# Patient Record
Sex: Female | Born: 1940 | Race: Black or African American | Hispanic: No | Marital: Single | Smoking: Former smoker
Health system: Southern US, Community
[De-identification: ages and names within clinical notes are randomized; demographics above are authoritative.]

## PROBLEM LIST (undated history)

## (undated) DIAGNOSIS — I639 Cerebral infarction, unspecified: Secondary | ICD-10-CM

## (undated) DIAGNOSIS — E0590 Thyrotoxicosis, unspecified without thyrotoxic crisis or storm: Secondary | ICD-10-CM

## (undated) DIAGNOSIS — Z5189 Encounter for other specified aftercare: Secondary | ICD-10-CM

## (undated) DIAGNOSIS — I10 Essential (primary) hypertension: Secondary | ICD-10-CM

## (undated) DIAGNOSIS — E7800 Pure hypercholesterolemia, unspecified: Secondary | ICD-10-CM

## (undated) HISTORY — DX: Cerebral infarction, unspecified: I63.9

## (undated) HISTORY — PX: OTHER SURGICAL HISTORY: SHX169

---

## 1997-10-24 ENCOUNTER — Emergency Department (HOSPITAL_COMMUNITY): Admission: EM | Admit: 1997-10-24 | Discharge: 1997-10-24 | Payer: Self-pay | Admitting: Emergency Medicine

## 1999-08-13 ENCOUNTER — Other Ambulatory Visit: Admission: RE | Admit: 1999-08-13 | Discharge: 1999-08-13 | Payer: Self-pay | Admitting: Family Medicine

## 1999-09-10 ENCOUNTER — Encounter: Payer: Self-pay | Admitting: Family Medicine

## 1999-09-10 ENCOUNTER — Ambulatory Visit (HOSPITAL_COMMUNITY): Admission: RE | Admit: 1999-09-10 | Discharge: 1999-09-10 | Payer: Self-pay | Admitting: Family Medicine

## 1999-09-16 ENCOUNTER — Encounter: Payer: Self-pay | Admitting: Family Medicine

## 1999-09-16 ENCOUNTER — Encounter: Admission: RE | Admit: 1999-09-16 | Discharge: 1999-09-16 | Payer: Self-pay | Admitting: Family Medicine

## 2000-08-27 ENCOUNTER — Encounter: Payer: Self-pay | Admitting: Emergency Medicine

## 2000-08-27 ENCOUNTER — Emergency Department (HOSPITAL_COMMUNITY): Admission: EM | Admit: 2000-08-27 | Discharge: 2000-08-27 | Payer: Self-pay | Admitting: Emergency Medicine

## 2000-09-01 ENCOUNTER — Emergency Department (HOSPITAL_COMMUNITY): Admission: EM | Admit: 2000-09-01 | Discharge: 2000-09-01 | Payer: Self-pay | Admitting: Emergency Medicine

## 2000-09-01 ENCOUNTER — Encounter: Payer: Self-pay | Admitting: Emergency Medicine

## 2002-01-03 ENCOUNTER — Other Ambulatory Visit: Admission: RE | Admit: 2002-01-03 | Discharge: 2002-01-03 | Payer: Self-pay | Admitting: Family Medicine

## 2002-06-14 ENCOUNTER — Ambulatory Visit (HOSPITAL_COMMUNITY): Admission: RE | Admit: 2002-06-14 | Discharge: 2002-06-14 | Payer: Self-pay | Admitting: Family Medicine

## 2002-06-14 ENCOUNTER — Encounter: Payer: Self-pay | Admitting: Family Medicine

## 2005-06-22 ENCOUNTER — Encounter: Payer: Self-pay | Admitting: Emergency Medicine

## 2006-03-14 ENCOUNTER — Emergency Department (HOSPITAL_COMMUNITY): Admission: EM | Admit: 2006-03-14 | Discharge: 2006-03-14 | Payer: Self-pay | Admitting: Emergency Medicine

## 2006-09-12 ENCOUNTER — Encounter: Payer: Self-pay | Admitting: Family Medicine

## 2007-01-17 ENCOUNTER — Ambulatory Visit: Payer: Self-pay | Admitting: Family Medicine

## 2007-01-17 DIAGNOSIS — R9431 Abnormal electrocardiogram [ECG] [EKG]: Secondary | ICD-10-CM

## 2007-01-17 DIAGNOSIS — E785 Hyperlipidemia, unspecified: Secondary | ICD-10-CM

## 2007-01-17 DIAGNOSIS — I10 Essential (primary) hypertension: Secondary | ICD-10-CM

## 2007-01-22 ENCOUNTER — Telehealth (INDEPENDENT_AMBULATORY_CARE_PROVIDER_SITE_OTHER): Payer: Self-pay | Admitting: *Deleted

## 2007-01-26 ENCOUNTER — Ambulatory Visit: Payer: Self-pay | Admitting: Cardiovascular Disease

## 2007-02-06 ENCOUNTER — Ambulatory Visit: Payer: Self-pay

## 2007-02-06 ENCOUNTER — Encounter: Payer: Self-pay | Admitting: Cardiovascular Disease

## 2007-05-14 ENCOUNTER — Ambulatory Visit: Payer: Self-pay | Admitting: Family Medicine

## 2007-05-14 DIAGNOSIS — B49 Unspecified mycosis: Secondary | ICD-10-CM

## 2007-05-14 LAB — CONVERTED CEMR LAB
ALT: 10 units/L (ref 0–35)
Total Bilirubin: 0.8 mg/dL (ref 0.3–1.2)
Triglycerides: 51 mg/dL (ref 0–149)
VLDL: 10 mg/dL (ref 0–40)

## 2007-05-17 ENCOUNTER — Encounter: Payer: Self-pay | Admitting: Family Medicine

## 2007-05-29 ENCOUNTER — Telehealth: Payer: Self-pay | Admitting: Family Medicine

## 2007-12-16 DIAGNOSIS — IMO0002 Reserved for concepts with insufficient information to code with codable children: Secondary | ICD-10-CM

## 2007-12-17 ENCOUNTER — Ambulatory Visit: Payer: Self-pay | Admitting: Family Medicine

## 2008-01-23 ENCOUNTER — Other Ambulatory Visit: Admission: RE | Admit: 2008-01-23 | Discharge: 2008-01-23 | Payer: Self-pay | Admitting: Family Medicine

## 2008-01-23 ENCOUNTER — Ambulatory Visit: Payer: Self-pay | Admitting: Family Medicine

## 2008-01-23 ENCOUNTER — Encounter: Payer: Self-pay | Admitting: Family Medicine

## 2008-01-23 ENCOUNTER — Telehealth: Payer: Self-pay | Admitting: Family Medicine

## 2008-01-24 ENCOUNTER — Telehealth: Payer: Self-pay | Admitting: Family Medicine

## 2008-01-29 ENCOUNTER — Encounter: Admission: RE | Admit: 2008-01-29 | Discharge: 2008-01-29 | Payer: Self-pay | Admitting: Family Medicine

## 2008-01-31 ENCOUNTER — Ambulatory Visit: Payer: Self-pay | Admitting: Internal Medicine

## 2008-02-24 ENCOUNTER — Telehealth: Payer: Self-pay | Admitting: Internal Medicine

## 2008-02-25 ENCOUNTER — Encounter: Payer: Self-pay | Admitting: Internal Medicine

## 2008-02-25 ENCOUNTER — Ambulatory Visit: Payer: Self-pay | Admitting: Internal Medicine

## 2008-03-04 ENCOUNTER — Encounter: Payer: Self-pay | Admitting: Internal Medicine

## 2008-09-01 ENCOUNTER — Ambulatory Visit: Payer: Self-pay | Admitting: Family Medicine

## 2008-09-01 LAB — CONVERTED CEMR LAB
CO2: 30 meq/L (ref 19–32)
GFR calc non Af Amer: 80.15 mL/min (ref >60)

## 2008-09-04 ENCOUNTER — Telehealth: Payer: Self-pay | Admitting: Family Medicine

## 2008-09-19 ENCOUNTER — Telehealth: Payer: Self-pay | Admitting: Family Medicine

## 2008-12-19 ENCOUNTER — Telehealth: Payer: Self-pay | Admitting: Family Medicine

## 2009-01-29 ENCOUNTER — Ambulatory Visit: Payer: Self-pay | Admitting: Internal Medicine

## 2009-01-29 ENCOUNTER — Encounter: Payer: Self-pay | Admitting: Family Medicine

## 2009-01-29 ENCOUNTER — Encounter: Admission: RE | Admit: 2009-01-29 | Discharge: 2009-01-29 | Payer: Self-pay | Admitting: Family Medicine

## 2009-05-12 ENCOUNTER — Telehealth: Payer: Self-pay | Admitting: Family Medicine

## 2009-12-28 ENCOUNTER — Ambulatory Visit: Payer: Self-pay | Admitting: Family Medicine

## 2009-12-28 DIAGNOSIS — L255 Unspecified contact dermatitis due to plants, except food: Secondary | ICD-10-CM | POA: Insufficient documentation

## 2009-12-28 LAB — CONVERTED CEMR LAB
Alkaline Phosphatase: 89 units/L (ref 39–117)
Basophils Relative: 0.6 % (ref 0.0–3.0)
CO2: 32 meq/L (ref 19–32)
Calcium: 10.3 mg/dL (ref 8.4–10.5)
Chloride: 100 meq/L (ref 96–112)
Eosinophils Absolute: 0.5 10*3/uL (ref 0.0–0.7)
Eosinophils Relative: 8.8 % — ABNORMAL HIGH (ref 0.0–5.0)
Glucose, Bld: 123 mg/dL — ABNORMAL HIGH (ref 70–99)
HCT: 38 % (ref 36.0–46.0)
Hemoglobin: 13.1 g/dL (ref 12.0–15.0)
Lymphocytes Relative: 30 % (ref 12.0–46.0)
MCHC: 34.5 g/dL (ref 30.0–36.0)
MCV: 93.6 fL (ref 78.0–100.0)
Neutrophils Relative %: 50.3 % (ref 43.0–77.0)
RBC: 4.06 M/uL (ref 3.87–5.11)
TSH: 3.64 microintl units/mL (ref 0.35–5.50)
Total CHOL/HDL Ratio: 5
Total Protein: 6.7 g/dL (ref 6.0–8.3)

## 2009-12-29 ENCOUNTER — Telehealth: Payer: Self-pay | Admitting: Family Medicine

## 2009-12-31 ENCOUNTER — Telehealth: Payer: Self-pay | Admitting: Family Medicine

## 2010-01-07 ENCOUNTER — Encounter: Payer: Self-pay | Admitting: Family Medicine

## 2010-01-07 ENCOUNTER — Ambulatory Visit: Payer: Self-pay | Admitting: Family Medicine

## 2010-01-07 ENCOUNTER — Other Ambulatory Visit: Admission: RE | Admit: 2010-01-07 | Discharge: 2010-01-07 | Payer: Self-pay | Admitting: Family Medicine

## 2010-01-13 ENCOUNTER — Ambulatory Visit: Payer: Self-pay | Admitting: Family Medicine

## 2010-01-19 ENCOUNTER — Telehealth: Payer: Self-pay | Admitting: Family Medicine

## 2010-01-27 ENCOUNTER — Ambulatory Visit: Payer: Self-pay | Admitting: Internal Medicine

## 2010-01-27 DIAGNOSIS — B0229 Other postherpetic nervous system involvement: Secondary | ICD-10-CM | POA: Insufficient documentation

## 2010-01-28 ENCOUNTER — Ambulatory Visit: Payer: Self-pay | Admitting: Family Medicine

## 2010-02-19 ENCOUNTER — Encounter
Admission: RE | Admit: 2010-02-19 | Discharge: 2010-02-19 | Payer: Self-pay | Source: Home / Self Care | Attending: Family Medicine | Admitting: Family Medicine

## 2010-03-21 LAB — CONVERTED CEMR LAB
ALT: 13 units/L (ref 0–35)
AST: 14 units/L (ref 0–37)
Alkaline Phosphatase: 110 units/L (ref 39–117)
Alkaline Phosphatase: 110 units/L (ref 39–117)
Basophils Absolute: 0 10*3/uL (ref 0.0–0.1)
Basophils Relative: 0.5 % (ref 0.0–3.0)
Basophils Relative: 2.3 % — ABNORMAL HIGH (ref 0.0–1.0)
Bilirubin, Direct: 0.1 mg/dL (ref 0.0–0.3)
Bilirubin, Direct: 0.1 mg/dL (ref 0.0–0.3)
Blood in Urine, dipstick: NEGATIVE
CO2: 31 meq/L (ref 19–32)
CO2: 31 meq/L (ref 19–32)
Calcium: 9.2 mg/dL (ref 8.4–10.5)
Chloride: 102 meq/L (ref 96–112)
Chloride: 104 meq/L (ref 96–112)
Creatinine, Ser: 0.7 mg/dL (ref 0.4–1.2)
Direct LDL: 186.4 mg/dL
Direct LDL: 215.3 mg/dL
GFR calc non Af Amer: 89 mL/min
GFR calc non Af Amer: 89 mL/min
Glucose, Bld: 103 mg/dL — ABNORMAL HIGH (ref 70–99)
Glucose, Bld: 143 mg/dL — ABNORMAL HIGH (ref 70–99)
HCT: 39.7 % (ref 36.0–46.0)
Hgb A1c MFr Bld: 6.6 % — ABNORMAL HIGH (ref 4.6–6.0)
Ketones, urine, test strip: NEGATIVE
Lymphocytes Relative: 31 % (ref 12.0–46.0)
MCHC: 34.5 g/dL (ref 30.0–36.0)
MCV: 93.6 fL (ref 78.0–100.0)
MCV: 94.8 fL (ref 78.0–100.0)
Microalb Creat Ratio: 9.1 mg/g (ref 0.0–30.0)
Microalb, Ur: 0.4 mg/dL (ref 0.0–1.9)
Microalb, Ur: 0.5 mg/dL (ref 0.0–1.9)
Monocytes Absolute: 0.4 10*3/uL (ref 0.1–1.0)
Monocytes Absolute: 0.4 10*3/uL (ref 0.2–0.7)
Monocytes Relative: 6.4 % (ref 3.0–11.0)
Neutro Abs: 3.5 10*3/uL (ref 1.4–7.7)
Neutro Abs: 3.9 10*3/uL (ref 1.4–7.7)
Nitrite: NEGATIVE
Potassium: 4 meq/L (ref 3.5–5.1)
RBC: 4.19 M/uL (ref 3.87–5.11)
RDW: 12.7 % (ref 11.5–14.6)
Sodium: 142 meq/L (ref 135–145)
TSH: 3.53 microintl units/mL (ref 0.35–5.50)
TSH: 4.51 microintl units/mL (ref 0.35–5.50)
Total Bilirubin: 0.8 mg/dL (ref 0.3–1.2)
Total Bilirubin: 0.9 mg/dL (ref 0.3–1.2)
Total Protein: 6.9 g/dL (ref 6.0–8.3)
Total Protein: 7.1 g/dL (ref 6.0–8.3)
Triglycerides: 85 mg/dL (ref 0–149)
VLDL: 17 mg/dL (ref 0–40)
WBC Urine, dipstick: NEGATIVE
pH: 7

## 2010-03-23 NOTE — Assessment & Plan Note (Signed)
Summary: rash on face/?allergic reaction or poison ivy/cjr   Vital Signs:  Patient profile:   70 year old female Height:      60 inches Weight:      154 pounds BMI:     30.18 Temp:     99.1 degrees F oral BP sitting:   140 / 80  (left arm) Cuff size:   regular  Vitals Entered By: Kern Reap CMA Duncan Dull) (December 28, 2009 12:53 PM) CC: rash on face, refills   CC:  rash on face and refills.  History of Present Illness: Amanda Conway is a 70 year old female, nonsmoker, who comes in today for a contact dermatitis of her face.  She helped a friend do yard work and developed a skin rash involving her neck, left wrist, and face x 4 days.  It is red, raised, and very pleuritic pain.  Her last hemoglobin A1c was 7.6, July 2010.  She was advised to come in for an annual physical exam and follow-up with her diabetes.  Every 3 months.  She has been non- compliant.  Allergies: 1)  ! Sulfa 2)  ! * Banana  Past History:  Past medical, surgical, family and social histories (including risk factors) reviewed for relevance to current acute and chronic problems.  Past Medical History: Reviewed history from 01/17/2007 and no changes required. 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 Family History Diabetes 1st degree relative Family History High cholesterol Family History Hypertension  Social History: Reviewed history from 01/17/2007 and no changes required. Occupation: Single Never Smoked Alcohol use-no Drug use-no Regular exercise-no  Review of Systems      See HPI  Physical Exam  General:  Well-developed,well-nourished,in no acute distress; alert,appropriate and cooperative throughout examination Skin:  a rash on the face and neck.  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 Take one tablet daily    Amaryl 4 Mg Tabs (Glimepiride) .Marland Kitchen... Take one tablet daliy  Orders: Venipuncture (60454) TLB-Lipid Panel (80061-LIPID) TLB-BMP (Basic Metabolic Panel-BMET) (80048-METABOL) TLB-CBC Platelet - w/Differential (85025-CBCD) TLB-Hepatic/Liver Function Pnl (80076-HEPATIC) TLB-TSH (Thyroid Stimulating Hormone) (84443-TSH) TLB-A1C / Hgb A1C (Glycohemoglobin) (83036-A1C) TLB-Microalbumin/Creat Ratio, Urine (82043-MALB) Prescription Created Electronically [phone]) UA Dipstick w/Micro (automated) (81001) Specimen Handling (91478) Glucose, (CBG) (29562)  Problem # 2:  CONTACT DERMATITIS&OTHER ECZEMA DUE TO PLANTS (ICD-692.6) Assessment: New  Her updated medication list for this problem includes:    Prednisone 20 Mg Tabs (Prednisone) ..... Uad  Orders: Venipuncture (13086) TLB-Lipid Panel (80061-LIPID) TLB-BMP (Basic Metabolic Panel-BMET) (80048-METABOL) TLB-CBC Platelet - w/Differential (85025-CBCD) TLB-Hepatic/Liver Function Pnl (80076-HEPATIC) TLB-TSH (Thyroid Stimulating Hormone) (84443-TSH) TLB-A1C / Hgb A1C (Glycohemoglobin) (83036-A1C) TLB-Microalbumin/Creat Ratio, Urine (82043-MALB) Prescription Created Electronically [phone]) UA Dipstick w/Micro (automated) (81001) Specimen Handling (96295)  Complete Medication List: 1)  Lotrel 5-20 Mg Caps (Amlodipine besy-benazepril hcl) .... Take one tablet daily 2)  Amaryl 4 Mg Tabs (Glimepiride) .... Take one tablet daliy 3)  Thalitone 15 Mg Tabs (Chlorthalidone) .... Take 1 tablet by mouth every morning 4)  Prednisone 20 Mg Tabs (Prednisone) .... Uad  Patient Instructions: 1)  begin prednisone at  one tablet daily, x 3 days, a  half a tablet x 3 days, then half a tablet of the other day for two week taper. 2)  Return next Tuesday for complete physical exam......... schedule a 30 minute time slot........Marland Kitchen we will do all your lab work today. 3)  Check a fasting blood sugar daily in the morning Prescriptions: THALITONE 15 MG  TABS (CHLORTHALIDONE) Take 1 tablet by mouth every morning  #30 Tablet x 0   Entered and Authorized by:   Roderick Pee MD   Signed by:   Roderick Pee MD on 12/28/2009   Method used:   Electronically to        [facility] ([facility]ail)       [address]       [place], [place]  [address]       Ph: [phone]       Fax: [phone]   RxID:   [id number] AMARYL 4 MG  TABS (GLIMEPIRIDE) take one tablet daliy  #30 Tablet x 0   Entered and Authorized by:   Roderick Pee MD   Signed by:   Roderick Pee MD on 12/28/2009   Method used:   Electronically to        [facility] (retail)       [address]       [place], [place]  [address]       Ph: [phone]       Fax: [phone]   RxID:   [id number] LOTREL 5-20 MG  CAPS (AMLODIPINE BESY-BENAZEPRIL HCL) take one tablet daily  #30 Capsule x 0   Entered and Authorized by:   Roderick Pee MD   Signed by:   Roderick Pee MD on 12/28/2009   Method used:   Electronically to        [facility] ([facility])       [address]       [place], [place]  [address]       Ph: [phone]       Fax: [phone]   RxID:   [id number] PREDNISONE 20 MG TABS (PREDNISONE) UAD  #30 x 0   Entered and Authorized by:   Roderick Pee MD   Signed by:   Roderick Pee MD on 12/28/2009   Method used:   Electronically to        [facility] (retail)       [address]       [place], [place]  [address]       Ph: [phone]       Fax: [phone]   RxID:   [id number]    Orders Added: 1)   Venipuncture [36144] 2)  TLB-Lipid Panel [80061-LIPID] 3)  TLB-BMP (Basic Metabolic Panel-BMET) [80048-METABOL] 4)  TLB-CBC Platelet - w/Differential [85025-CBCD] 5)  TLB-Hepatic/Liver Function Pnl [80076-HEPATIC] 6)  TLB-TSH (Thyroid Stimulating Hormone) [84443-TSH] 7)  TLB-A1C / Hgb A1C (Glycohemoglobin) [83036-A1C] 8)  TLB-Microalbumin/Creat Ratio, Urine [82043-MALB] 9)  Prescription Created Electronically [G8553] 10)  Est. Patient Level IV [31540] 11)  UA Dipstick w/Micro (automated) [81001] 12)  Specimen Handling [99000] 13)  Glucose, (CBG) [82962]  Appended Document: rash on face/?allergic reaction or poison ivy/cjr  Laboratory Results   Urine Tests    Routine  Urinalysis   Color: yellow Appearance: Clear Glucose: trace   (Normal Range: Negative) Bilirubin: negative   (Normal Range: Negative) Ketone: negative   (Normal Range: Negative) Spec. Gravity: 1.020   (Normal Range: 1.003-1.035) Blood: negative   (Normal Range: Negative) pH: 6.5   (Normal Range: 5.0-8.0) Protein: negative   (Normal Range: Negative) Urobilinogen: 0.2   (Normal Range: 0-1) Nitrite: negative   (Normal Range: Negative) Leukocyte Esterace: negative   (Normal Range: Negative)    Comments: Rita Ohara  December 28, 2009 2:01 PM

## 2010-03-23 NOTE — Assessment & Plan Note (Signed)
Summary: 1 week fup per dr//ccm   Vital Signs:  Patient profile:   70 year old female Menstrual status:  postmenopausal Weight:      154 pounds Temp:     98.9 degrees F oral BP sitting:   130 / 80  (left arm) Cuff size:   regular  Vitals Entered By: Kern Reap CMA Duncan Dull) (January 13, 2010 11:05 AM) CC: follow-up visit   CC:  follow-up visit.  History of Present Illness: Amanda Conway is a 69 year old female, who comes in today for follow-up of a severe contact dermatitis with underlying diabetes.  We originally tried the topical medication.  However, the rash got worse.  We therefore put on prednisone two tabs x 3 days with a taper.  Now she is down to one and then the rash is almost 100% gone.  It did increase her blood sugar, but had been over 300.  Allergies: 1)  ! Sulfa 2)  ! 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 Uad  Complete Medication List: 1)  Lotrel 5-20 Mg Caps (Amlodipine besy-benazepril hcl) .... Take one tablet daily 2)  Prednisone 20 Mg Tabs (Prednisone) .... Uad 3)  Chlorthalidone 25 Mg Tabs (Chlorthalidone) .... Take one tab by mouth once daily 4)  Zocor 10 Mg Tabs (Simvastatin) .Marland Kitchen.. 1 tab @ bedtime 5)  Glucotrol Xl 5 Mg Xr24h-tab (Glipizide) .... Take 1 tablet by mouth two times a day  Patient Instructions: 1)  taper the prednisone slowly by taking one tablet today and tomorrow, then a half a tablet a day for 3 days, then half a tablet Monday, Wednesday, Friday, for a two-week taper.  Return p.r.n.   Orders Added: 1)  Est. Patient Level III  [[address]]

## 2010-03-23 NOTE — Miscellaneous (Signed)
Summary: ACCORD Study  ACCORD Study   Imported By: Maryln Gottron 06/08/2009 13:09:30  _____________________________________________________________________  External Attachment:    Type:   Image     Comment:   External Document

## 2010-03-23 NOTE — Progress Notes (Signed)
Summary: pharmacy change  Phone Note Refill Request Message from:  Fax from Pharmacy on May 12, 2009 10:39 AM  Refills Requested: Medication #1:  AMARYL 4 MG  TABS take one tablet daliy  Method Requested: Electronic Initial call taken by: Kern Reap CMA Duncan Dull),  May 12, 2009 10:40 AM    Prescriptions: AMARYL 4 MG  TABS (GLIMEPIRIDE) take one tablet daliy  #30 Tablet x 6   Entered by:   Kern Reap CMA (AAMA)   Authorized by:   Roderick Pee MD   Signed by:   Kern Reap CMA (AAMA) on 05/12/2009   Method used:   Electronically to        [facility] (retail)       [address]       [place], [place]  [address]       Ph: [phone]       Fax: [phone]   RxID:   [id number]

## 2010-03-23 NOTE — Progress Notes (Signed)
Summary: Pt arm sore and swollen from injection. Req to take Ibuprofen  Phone Note Call from Patient Call back at [facility] Phone [phone]   Caller: Patient Summary of Call: Pt called and said that her rt arm is extremely sore and also swollen, from the injections she rcvd about 2 wks ago. Pt is wondering if she could take Ibuprofen for inflamation and pain? Pls call.     Initial call taken by: Lucy Antigua,  January 19, 2010 9:04 AM  Follow-up for Phone Call        .Rachel please call......... symptomatic therapy with ice 15 minutes 4 times a day Motrin 400 mg with food twice daily Follow-up by: Roderick Pee MD,  January 19, 2010 12:21 PM  Additional Follow-up for Phone Call Additional follow up Details #1::        patient is aware Additional Follow-up by: Kern Reap CMA Duncan Dull),  January 19, 2010 2:53 PM

## 2010-03-23 NOTE — Assessment & Plan Note (Signed)
Summary: cpx per dr//ccm   Vital Signs:  Patient profile:   70 year old female Menstrual status:  postmenopausal Height:      60 inches Weight:      155 pounds BMI:     30.38 Temp:     98.4 degrees F oral BP sitting:   128 / 78  (left arm) Cuff size:   regular  Vitals Entered By: Kern Reap CMA Duncan Dull) (January 07, 2010 12:31 PM) CC: wellness exam Is Patient Diabetic? Yes Did you bring your meter with you today? Yes     Menstrual Status postmenopausal Last PAP Result NEGATIVE FOR INTRAEPITHELIAL LESIONS OR MALIGNANCY.   CC:  wellness exam.  History of Present Illness: Amanda Conway is a 70 year old female, nonsmoker, type II diabetic with hypertension, who comes in today for Medicare wellness examination.  Her hypertension is treated with Lotrel 5 -- 20 daily BP 120/78.  Her diabetes is treated with Amaryl 4 mg daily fasting blood sugar 123 however hemoglobin A1c 8.4%.  We will need to change her medication.we have tried metformin in the past, however, it causes abdominal pain, and diarrhea  She is currently on prednisone because of a severe bout of contact dermatitis.  She's down to one tablet daily.  She gets routine eye care, dental care, BSE monthly, annual mammography, colonoscopy, 2008 normal, tetanus, and flu shot today, Pneumovax, and shingles in one month. Here for Medicare AWV:  1.   Risk factors based on Past M, S, F history:...reviewed.  No changes 2.   Physical Activities: walks daily 3.   Depression/mood: good mood.  No depression 4.   Hearing: normal 5.   ADL's: normal 6.   Fall Risk: reviewed.  None identified at 7.   Home Safety: reviewed.  No guns in the house 8.   Height, weight, &visual acuity:height weight, normal.  Vision normal.  Ophthalmologic evaluation October 2010 no retinopathy 9.   Counseling: change medication as outlined because blood sugar and A1c are elevated 10.   Labs ordered based on risk factors: already done 11.           Referral  Coordination..........none indicated 12.           Care Plan......Marland Kitchenreviewed medications, diet, exercise and diet 13.            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 Sulfa 2)  ! * Banana  Past History:  Past medical, surgical, family and social histories (including risk factors) reviewed, and no changes noted (except as noted below).  Past Medical History: Reviewed history from 01/17/2007 and no changes required. 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 Family History Diabetes 1st degree relative Family History High cholesterol Family History Hypertension  Social History: Reviewed history from 01/17/2007 and no changes required. Occupation: Single Never Smoked Alcohol use-no Drug use-no Regular exercise-no  Review of Systems      See HPI       Flu Vaccine Consent Questions     Do you have a history of severe allergic reactions to this vaccine? no    Any prior history of allergic reactions to egg and/or gelatin? no    Do you have a sensitivity to the preservative Thimersol? no    Do you have a past history of Guillan-Barre Syndrome? no    Do you currently have an acute febrile  illness? no    Have you ever had a severe reaction to latex? no    Vaccine information given and explained to patient? yes    Are you currently pregnant? no    Lot Number:[id number]   Exp Date:08/21/2010   Site Given  Left Deltoid IM   Physical Exam  General:  Well-developed,well-nourished,in no acute distress; alert,appropriate and cooperative throughout examination Head:  Normocephalic and atraumatic without obvious abnormalities. No apparent alopecia or balding. Eyes:  No corneal or conjunctival inflammation noted. EOMI. Perrla. Funduscopic exam benign, without hemorrhages, exudates or papilledema. Vision grossly  normal. Ears:  External ear exam shows no significant lesions or deformities.  Otoscopic examination reveals clear canals, tympanic membranes are intact bilaterally without bulging, retraction, inflammation or discharge. Hearing is grossly normal bilaterally. Nose:  External nasal examination shows no deformity or inflammation. Nasal mucosa are pink and moist without lesions or exudates. Mouth:  Oral mucosa and oropharynx without lesions or exudates.  Teeth in good repair. Neck:  No deformities, masses, or tenderness noted. Chest Wall:  No deformities, masses, or tenderness noted. Breasts:  No mass, nodules, thickening, tenderness, bulging, retraction, inflamation, nipple discharge or skin changes noted.   Lungs:  Normal respiratory effort, chest expands symmetrically. Lungs are clear to auscultation, no crackles or wheezes. Heart:  Normal rate and regular rhythm. S1 and S2 normal without gallop, murmur, click, rub or other extra sounds. Abdomen:  Bowel sounds positive,abdomen soft and non-tender without masses, organomegaly or hernias noted. Rectal:  No external abnormalities noted. Normal sphincter tone. No rectal masses or tenderness. Genitalia:  Pelvic Exam:        External: normal female genitalia without lesions or masses        Vagina: normal without lesions or masses        Cervix: normal without lesions or masses        Adnexa: normal bimanual exam without masses or fullness        Uterus: normal by palpation        Pap smear: performed Msk:  No deformity or scoliosis noted of thoracic or lumbar spine.   Pulses:  R and L carotid,radial,femoral,dorsalis pedis and posterior tibial pulses are full and equal bilaterally Extremities:  No clubbing, cyanosis, edema, or deformity noted with normal full range of motion of all joints.   Neurologic:  No cranial nerve deficits noted. Station and gait are normal. Plantar reflexes are down-going bilaterally. DTRs are symmetrical throughout. Sensory,  motor and coordinative functions appear intact. Skin:  Intact without suspicious lesions or rashes Cervical Nodes:  No lymphadenopathy noted Axillary Nodes:  No palpable lymphadenopathy Inguinal Nodes:  No significant adenopathy Psych:  Cognition and judgment appear intact. Alert and cooperative with normal attention span and concentration. No apparent delusions, illusions, hallucinations  Diabetes Management Exam:    Foot Exam (with socks and/or shoes not present):       Sensory-Pinprick/Light touch:          Left medial foot (L-4): normal          Left dorsal foot (L-5): normal          Left lateral foot (S-1): normal          Right medial foot (L-4): normal          Right dorsal foot (L-5): normal          Right lateral foot (S-1): normal       Sensory-Monofilament:  Left foot: normal          Right foot: normal       Inspection:          Left foot: normal          Right foot: normal       Nails:          Left foot: normal          Right foot: normal    Eye Exam:       Eye Exam done elsewhere          Date: 12/04/2008          Results: normal          Done by: opth   Impression & Recommendations:  Problem # 1:  HYPERTENSION (ICD-401.9) Assessment Improved  Her updated medication list for this problem includes:    Lotrel 5-20 Mg Caps (Amlodipine besy-benazepril hcl) .Marland Kitchen... Take one tablet daily    Chlorthalidone 25 Mg Tabs (Chlorthalidone) .Marland Kitchen... Take one tab by mouth once daily  Orders: Prescription Created Electronically [phone]) Medicare -1st Annual Wellness Visit [id number]) EKG w/ Interpretation (93000)  Problem # 2:  HYPERLIPIDEMIA (ICD-272.4) Assessment: Deteriorated  Her updated medication list for this problem includes:    Zocor 10 Mg Tabs (Simvastatin) .Marland Kitchen... 1 tab @ bedtime  Orders: Prescription Created Electronically [phone]) Medicare -1st Annual Wellness Visit [phone]) EKG w/ Interpretation (93000)  Problem # 3:  DIABETES MELLITUS, TYPE II  (ICD-250.00) Assessment: Deteriorated  The following medications were removed from the medication list:    Amaryl 4 Mg Tabs (Glimepiride) .Marland Kitchen... Take one tablet daliy    Metformin Hcl 500 Mg Tabs (Metformin hcl) .Marland Kitchen... 1/2 by mouth two times a day Her updated medication list for this problem includes:    Lotrel 5-20 Mg Caps (Amlodipine besy-benazepril hcl) .Marland Kitchen... Take one tablet daily    Glucotrol Xl 5 Mg Xr24h-tab (Glipizide) .Marland Kitchen... Take 1 tablet by mouth two times a day  Orders: Prescription Created Electronically [phone]) Medicare -1st Annual Wellness Visit [phone])  Problem # 4:  Preventive Health Care (ICD-V70.0) Assessment: Unchanged  Complete Medication List: 1)  Lotrel 5-20 Mg Caps (Amlodipine besy-benazepril hcl) .... Take one tablet daily 2)  Prednisone 20 Mg Tabs (Prednisone) .... Uad 3)  Chlorthalidone 25 Mg Tabs (Chlorthalidone) .... Take one tab by mouth once daily 4)  Zocor 10 Mg Tabs (Simvastatin) .Marland Kitchen.. 1 tab @ bedtime 5)  Glucotrol Xl 5 Mg Xr24h-tab (Glipizide) .... Take 1 tablet by mouth two times a day  Other Orders: Admin 1st Vaccine (95188) Flu Vaccine 26yrs + (41660) Tdap => 91yrs IM (63016) Admin of Any Addtl Vaccine (01093)  Patient Instructions: 1)  discontinue the Amaryl. 2)  and begin simvastatin 10 mg one tablet daily at bedtime, along with an aspirin tablet 3)  Begin metformin one half tablet twice daily......... 500-mg tabs............. check a fasting blood sugar daily in the morning.  Return in one week for follow-up with the data and the device. 4)  Remember to stay on a sugar-free diet. 5)  Walk 15 minutes daily. 6)  Take calcium +Vitamin D daily. 7)  Take an Aspirin every day. 8)  Check your blood sugars regularly. If your readings are usually above : or below 70 you should contact our office. 9)  It is important that your Diabetic A1c level is checked every 3 months. 10)  See your eye doctor yearly to check for diabetic eye damage. 11)  Check your  feet  each night for sore areas, calluses or signs of infection. 12)  Check your Blood Pressure regularly. If it is above: you should make an appointment. 13)  increase the prednisone to one tablet daily, x 3 days, then half a tablet a day, x 3 days, then half a tablet Monday, Wednesday, Friday, for a two-week taper. 14)  Applied.  Small amounts of the cortisone cream 3 times daily Prescriptions: GLUCOTROL XL 5 MG XR24H-TAB (GLIPIZIDE) Take 1 tablet by mouth two times a day  #200 x 3   Entered and Authorized by:   Roderick Pee MD   Signed by:   Roderick Pee MD on 01/07/2010   Method used:   Electronically to        [facility] (retail)       [address]       [place], [place]  [address]       Ph: [phone]       Fax: [phone]   RxID:   [id number] ZOCOR 10 MG TABS (SIMVASTATIN) 1 tab @ bedtime  #100 x 3   Entered and Authorized by:   Roderick Pee MD   Signed by:   Roderick Pee MD on 01/07/2010   Method used:   Electronically to        [facility] (retail)       [address]       [place], [place]  [address]       Ph: [phone]       Fax: [phone]   RxID:   [id number] METFORMIN HCL 500 MG TABS (METFORMIN HCL) 1/2 by mouth two times a day  #100 x 3   Entered and Authorized by:   Roderick Pee MD   Signed by:   Roderick Pee MD on 01/07/2010   Method used:   Electronically to        [facility] (retail)       [address]       [place], [place]  [address]       Ph: [phone]       Fax: [phone]   RxID:   [id number]    Orders Added: 1)  Prescription Created Electronically [phone] 2)  Medicare -1st Annual Wellness Visit [G0438] 3)  EKG w/ Interpretation [93000] 4)  Admin 1st Vaccine [90471] 5)  Flu Vaccine 65yrs + [82993] 6)  Tdap => 38yrs IM [90715] 7)  Admin of Any Addtl Vaccine  [90472]   Immunizations Administered:  Tetanus Vaccine:    Vaccine Type: Tdap    Site: right deltoid    Mfr: GlaxoSmithKline    Dose: 0.5 ml    Route: IM    Given by: Kern Reap CMA (AAMA)    Exp. Date: 12/11/2011    Lot #: [id number]    Physician counseled: yes   Immunizations Administered:  Tetanus Vaccine:    Vaccine Type: Tdap    Site: right deltoid    Mfr: GlaxoSmithKline    Dose: 0.5 ml    Route: IM    Given by: Kern Reap CMA (AAMA)    Exp. Date: 12/11/2011    Lot #: [id number]    Physician counseled: yes

## 2010-03-23 NOTE — Progress Notes (Signed)
Summary: thalitone not covered  Phone Note From Pharmacy   Caller: [facility] Summary of Call: thalitone 15mg  is no longer covered by medicare.  they perfer chlorthalidone, HCTZ, indapapmide.  any suggestions? Initial call taken by: Kern Reap CMA Duncan Dull),  December 29, 2009 11:54 AM  Follow-up for Phone Call         chlorthalidone25  mg, dispense hundred directions one daily refills x 3 Follow-up by: Roderick Pee MD,  December 29, 2009 12:22 PM    New/Updated Medications: CHLORTHALIDONE 25 MG TABS (CHLORTHALIDONE) take one tab by mouth once daily Prescriptions: CHLORTHALIDONE 25 MG TABS (CHLORTHALIDONE) take one tab by mouth once daily  #90 x 3   Entered by:   Kern Reap CMA (AAMA)   Authorized by:   Roderick Pee MD   Signed by:   Kern Reap CMA (AAMA) on 12/29/2009   Method used:   Electronically to        [facility]       [address]       [place], [place]  [address]       Ph: [phone]       Fax: [phone]   RxID:   [id number]

## 2010-03-23 NOTE — Progress Notes (Signed)
Summary: itching  Phone Note Call from Patient Call back at Home Phone [phone]   Caller: Patient Call For: Roderick Pee MD Summary of Call: Pt has been been using Hydrocortisone for the rash and itching, but it is not helping, and would like some kind of cream to help the itching. [facility]) Initial call taken by: Lynann Beaver CMA AAMA,  December 31, 2009 8:18 AM  Follow-up for Phone Call        prednisone 20-mg tablets, dispense 30, directions one daily x 3 days, then half a tablet daily, x 3 days, then half a tablet Monday, Wednesday, Friday, for a two week taper, no refills Follow-up by: Roderick Pee MD,  December 31, 2009 8:27 AM  Additional Follow-up for Phone Call Additional follow up Details #1::        Pt is already on Prednisone and wants a cream not oral med if possible. Additional Follow-up by: Lynann Beaver CMA AAMA,  December 31, 2009 9:28 AM    Additional Follow-up for Phone Call Additional follow up Details #2::    continue the oral prednisone as we directed.  She can purchase over-the-counter steroid cream, use small amounts 3 times a day Follow-up by: Roderick Pee MD,  December 31, 2009 10:22 AM  Additional Follow-up for Phone Call Additional follow up Details #3:: Details for Additional Follow-up Action Taken: left message on machine for patient  Additional Follow-up by: Kern Reap CMA Duncan Dull),  December 31, 2009 2:59 PM

## 2010-03-23 NOTE — Assessment & Plan Note (Signed)
Summary: SHINGLES/NJR   Vital Signs:  Patient profile:   70 year old female Menstrual status:  postmenopausal Height:      60 inches Weight:      154 pounds BMI:     30.18 Temp:     98.2 degrees F oral Pulse rate:   72 / minute Resp:     14 per minute BP sitting:   140 / 80  (left arm)  Vitals Entered By: Willy Eddy, LPN (January 27, 2010 1:31 PM) CC: shingles on rt arm with pain, Hypertension Management Is Patient Diabetic? Yes Did you bring your meter with you today? No   Primary Care Provider:  Roderick Pee MD  CC:  shingles on rt arm with pain and Hypertension Management.  History of Present Illness: Seen as a walk in for pain in right forearm with large vesicular lesions that are painfull from elbow to the arm. These are painfull and she has tryed to "pop them" and has applied topical antibiotics. She had Chicken pox as a child. 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        Positive major cardiovascular risk factors include female age 73 years old or older, diabetes, hyperlipidemia, and hypertension.  Negative major cardiovascular risk factors include non-tobacco-user status.     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) 3)  Family History Diabetes 1st Degree Relative  (ICD-V18.0) 4)  Foreign Body, Foot  (ICD-917.6) 5)  Fungal Infection  (ICD-117.9) 6)  Nonspecific Abnormal  Electrocardiogram  (ICD-794.31) 7)  Hypertension  (ICD-401.9) 8)  Hyperlipidemia  (ICD-272.4) 9)  Diabetes Mellitus, Type II  (ICD-250.00)  Current Problems (verified): 1)  Routine General Medical Exam@health  Care Facl  (ICD-V70.0) 2)  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 Take One Tablet Daily 2)  Prednisone 20 Mg Tabs (Prednisone) .... Uad 3)  Chlorthalidone 25 Mg Tabs (Chlorthalidone) .... Take One Tab By Mouth Once Daily 4)  Zocor 10 Mg Tabs (Simvastatin) .Marland Kitchen.. 1 Tab @ Bedtime 5)  Glucotrol Xl 5 Mg Xr24h-Tab (Glipizide) .... Take 1 Tablet By Mouth Two Times A Day  Current Medications (verified): 1)  Lotrel 5-20 Mg  Caps (Amlodipine Besy-Benazepril Hcl) .... Take One Tablet Daily 2)  Prednisone 20 Mg Tabs (Prednisone) .... Uad 3)  Chlorthalidone 25 Mg Tabs (Chlorthalidone) .... Take One Tab By Mouth Once Daily 4)  Zocor 10 Mg Tabs (Simvastatin) .Marland Kitchen.. 1 Tab @ Bedtime 5)  Glucotrol Xl 5 Mg Xr24h-Tab (Glipizide) .... Take 1 Tablet By Mouth Two Times A Day  Allergies (verified): 1)  ! Sulfa  2)  ! * Banana  Past History:  Family History: Last updated: 01/23/2008 Family History Diabetes 1st degree relative Family History High cholesterol Family History Hypertension  Social History: Last updated: 01/17/2007 Occupation: Single Never Smoked Alcohol use-no Drug use-no Regular exercise-no  Risk Factors: Exercise: no (01/17/2007)  Risk Factors: Smoking Status: never (01/27/2010)  Past medical, surgical, family and social histories (including risk factors) reviewed, and  no changes noted (except as noted below).  Past Medical History: Reviewed history from 01/17/2007 and no changes required. 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 Family History Diabetes 1st degree relative Family History High cholesterol Family History Hypertension  Social History: Reviewed history from 01/17/2007 and no changes required. Occupation: Single Never Smoked Alcohol use-no Drug use-no Regular exercise-no  Review of Systems  The patient denies anorexia, fever, weight loss, weight gain, vision loss, decreased hearing, hoarseness, chest pain, syncope, dyspnea on exertion, peripheral edema, prolonged cough, headaches, hemoptysis, abdominal pain, melena, hematochezia, hematuria, incontinence, genital sores, muscle weakness, suspicious skin lesions, transient blindness, difficulty walking, depression, unusual weight change, abnormal bleeding, enlarged lymph nodes, angioedema, and breast masses.    Physical Exam  General:  Well-developed,well-nourished,in no acute distress; alert,appropriate and cooperative throughout examination Head:  Normocephalic and atraumatic without obvious abnormalities. No apparent alopecia or balding. Eyes:  pupils equal and pupils round.   Ears:  R ear normal and L ear normal.   Nose:  no external deformity and no nasal discharge.   Neck:  No deformities, masses, or tenderness noted. Lungs:  normal respiratory effort and no wheezes.   Heart:  normal rate and regular rhythm.   Abdomen:  Bowel sounds positive,abdomen soft and non-tender without masses, organomegaly or hernias noted. Msk:  No deformity or scoliosis noted of thoracic or lumbar spine.   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 Take one tablet daily    Chlorthalidone 25 Mg Tabs (Chlorthalidone) .Marland Kitchen... Take one tab by mouth once daily  BP today: 140/80 Prior BP: 130/80 (01/13/2010)  10 Yr Risk Heart Disease: Not enough information  Labs Reviewed: K+: 5.7 (12/28/2009) Creat: : 0.9 (12/28/2009)   Chol: 268 (12/28/2009)   HDL: 55.50 (12/28/2009)   LDL: DEL (01/23/2008)   TG: 91.0 (12/28/2009)  Problem # 3:  DIABETES MELLITUS, TYPE II (ICD-250.00) predisone has elevated glucoses she will stop the predisone Her updated medication list for this problem includes:    Lotrel 5-20 Mg Caps (Amlodipine besy-benazepril hcl) .Marland Kitchen... Take one tablet daily    Glucotrol Xl 5 Mg Xr24h-tab (Glipizide) .Marland Kitchen... Take 1 tablet by mouth two times a day  Complete Medication List: 1)  Lotrel 5-20 Mg Caps (Amlodipine besy-benazepril hcl) .... Take one tablet daily 2)  Prednisone 20 Mg Tabs (Prednisone) .... Uad 3)  Chlorthalidone 25 Mg Tabs (Chlorthalidone) .... Take one tab by mouth once daily 4)  Zocor 10 Mg Tabs (Simvastatin) .Marland Kitchen.. 1 tab @ bedtime 5)  Glucotrol Xl 5 Mg Xr24h-tab (Glipizide) .... Take 1 tablet by mouth two times a day 6)  Valacyclovir Hcl 1 Gm Tabs (Valacyclovir  hcl) .... One by mouth three times a day x 10 days 7)  Hydrocodone-acetaminophen 5-500 Mg Tabs (Hydrocodone-acetaminophen) .... One by mouth q 4-6 hours as needed pain  Hypertension Assessment/Plan:      The patient's hypertensive risk group is category C: Target organ damage and/or diabetes.  Today's blood pressure is 140/80.  Her blood pressure goal is < 130/80.  Patient Instructions: 1)  stop the predeisone and start the valacyclovir now Prescriptions: HYDROCODONE-ACETAMINOPHEN 5-500 MG TABS  (HYDROCODONE-ACETAMINOPHEN) one by mouth q 4-6 hours as needed pain  #30 x 0   Entered and Authorized by:   Stacie Glaze MD   Signed by:   Stacie Glaze MD on 01/27/2010   Method used:   Print then Give to Patient   RxID:   [id number] VALACYCLOVIR HCL 1 GM TABS (VALACYCLOVIR HCL) one by mouth three times a day x 10 days  #30 x 0   Entered and Authorized by:   Stacie Glaze MD   Signed by:   Stacie Glaze MD on 01/27/2010   Method used:   Electronically to        [facility] (retail)       [address]       [place], [place]  [address]       Ph: [phone]       Fax: [phone]   RxID:   [id number]    Orders Added: 1)  Est. Patient Level IV [74259]  Appended Document: SHINGLES/NJR jeff FYI

## 2010-03-25 ENCOUNTER — Other Ambulatory Visit: Payer: Self-pay | Admitting: Family Medicine

## 2010-03-25 DIAGNOSIS — E119 Type 2 diabetes mellitus without complications: Secondary | ICD-10-CM

## 2010-03-25 NOTE — Telephone Encounter (Signed)
Refill sent.

## 2010-03-26 ENCOUNTER — Telehealth: Payer: Self-pay | Admitting: Family Medicine

## 2010-03-26 NOTE — Telephone Encounter (Signed)
Fleet Contras please call the amyrel  was discontinued.  She is on Glucotrol 5 mg b.i.d., and she should follow-up every 3 months with a BMP, and an A1c

## 2010-03-26 NOTE — Telephone Encounter (Signed)
Pt called and said she has rcvd two different meds for diabetes, Glimepiride 4mg   1 qd and Glipizide ER  5mg  bid. Pt is wondering which one she is suppose to take. Pls call.

## 2010-03-29 NOTE — Telephone Encounter (Signed)
Left message on machine for patient

## 2010-03-30 ENCOUNTER — Other Ambulatory Visit: Payer: Self-pay | Admitting: *Deleted

## 2010-03-30 DIAGNOSIS — M3390 Dermatopolymyositis, unspecified, organ involvement unspecified: Secondary | ICD-10-CM

## 2010-03-30 DIAGNOSIS — I10 Essential (primary) hypertension: Secondary | ICD-10-CM

## 2010-03-30 MED ORDER — GLIPIZIDE ER 5 MG PO TB24: 5.0000 mg | ORAL_TABLET | Freq: Every day | ORAL | Status: DC

## 2010-03-30 MED ORDER — AMLODIPINE BESY-BENAZEPRIL HCL 5-20 MG PO CAPS: 1.0000 | ORAL_CAPSULE | Freq: Every day | ORAL | Status: DC

## 2010-04-01 ENCOUNTER — Telehealth: Payer: Self-pay | Admitting: *Deleted

## 2010-04-01 DIAGNOSIS — I10 Essential (primary) hypertension: Secondary | ICD-10-CM

## 2010-04-01 MED ORDER — AMLODIPINE BESY-BENAZEPRIL HCL 5-20 MG PO CAPS: 1.0000 | ORAL_CAPSULE | Freq: Every day | ORAL | Status: DC

## 2010-04-01 MED ORDER — GLIPIZIDE ER 5 MG PO TB24: 5.0000 mg | ORAL_TABLET | Freq: Every day | ORAL | Status: DC

## 2010-04-01 NOTE — Telephone Encounter (Signed)
Addended by: Kern Reap on: 04/01/2010 05:30 PM   Modules accepted: Orders

## 2010-04-01 NOTE — Telephone Encounter (Signed)
Amanda Conway, this pt needs her Glipizide 5 mg, and Lotrel refilled at [facility]), please.

## 2010-04-05 ENCOUNTER — Telehealth: Payer: Self-pay | Admitting: Family Medicine

## 2010-04-05 DIAGNOSIS — I10 Essential (primary) hypertension: Secondary | ICD-10-CM

## 2010-04-05 MED ORDER — GLIPIZIDE ER 5 MG PO TB24: 5.0000 mg | ORAL_TABLET | Freq: Two times a day (BID) | ORAL | Status: DC

## 2010-04-05 NOTE — Telephone Encounter (Signed)
Spoke with pharmacist, patient, and rx sent

## 2010-04-05 NOTE — Telephone Encounter (Signed)
Pt called and said Glipizide is suppose to be take 1 tablet by mouth twice a day. Pls correct script instructions and resend to [facility] [place] [phone]    Notify pt when this has been corrected and called.

## 2010-06-07 LAB — GLUCOSE, CAPILLARY: Glucose-Capillary: 80 mg/dL (ref 70–99)

## 2010-07-06 NOTE — Assessment & Plan Note (Signed)
[name]                            CARDIOLOGY OFFICE NOTE   Amanda, Conway                      MRN:          [id number]  DATE:01/26/2007                            DOB:          13-Jun-1940    Amanda Conway is here today for sluggish heart valves. She has multiple  coronary risk factors including hypertension, hypercholesterolemia, and  diabetes. She has been told she has had heart murmurs over the past. She  says Dr. Tawanna Cooler thought her heart valves were sluggish. In talking to her,  she has not had any undue dyspnea. She is on no palpitations for chest  pain. She has never had a stress test.   She does occasionally get swelling in her legs. Her diet is not  terrific, and she does still use salt. She has a diabetic for over 15  years. She is on cholesterol medicine and also Lotrel for hypertension.   Her review of systems otherwise is negative.   The patient's past medical history is fairly benign. She has some  constipation, some allergies and some arthritis. She has hypertension,  hyperlipidemia and diabetes. She has not had previous surgery.   She denies any allergies.   She is divorced. She has three children who are grown and live outside  of the house. 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 She has been  doing this for about 12 years. She is fairly sedentary.   FAMILY HISTORY:  Is remarkable for mother having a heart attack at age  70 and father dying at age 70 of cancer.   CURRENT MEDICATIONS:  Include:  1. Simvastatin 40 a day.  2. Thalitone 15 mg a day.  3. Amaryl 4 mg a day.  4. Aspirin a day.  5. Centrum.  6. Lotrel 5/20.   Her exam is remarkable for a healthy-appearing, middle-aged, black  female in no distress. Blood pressure is 130/80, pulse 84 and regular,  respiratory rate 14, afebrile. Weight was not in the chart.  HEENT:  Unremarkable.  Carotids normal without bruit. No lymphadenopathy, thyromegaly or JVP  elevation.  LUNGS:  Are clear. Good diaphragmatic motion. No wheezing.  S1 and S2 with a soft systolic murmur. PMI is normal.  ABDOMEN:  Benign. Bowel sounds positive. No AAA. No hepatosplenomegaly.  No hepatojugular reflux. No tenderness. No bruits.  Distal pulses were intact. No edema.  NEUROLOGICAL:  Nonfocal. No muscular weakness.   EKG shows sinus rhythm with poor R-wave progression, incomplete right  bundle branch block.   IMPRESSION:  1. Questionable valvular heart problem with soft systolic murmur.      Check 2-D echocardiogram. Doubt significant problem.  2. Multiple coronary risk factors without previous stress testing      including diabetes. Schedule stress Myoview study.  3. Hypercholesterolemia followed per Dr. Tawanna Cooler. Continue Simvastatin 40      a day. Lipid and liver profile in 6 months.  4. Abnormal EKG with voltage criteria for left ventricular hypertrophy      and poor RV progression. Check 2-D echocardiogram to assess for  left ventricular hypertrophy and silent anterior wall myocardial      infarction. Also having Myoview to rule out coronary disease.  5. Diabetes. Check hemoglobin A1c quarterly. Continue Amaryl.      Diabetic, low-cholesterol diet.  6. Hypertension. Continue reasonably controlled. Avoid salt. Continue      Lotrel 5/20.   Further recommendations based on echocardiogram and stress test.     Theron Arista C. Eden Emms, MD, [facility]  Electronically Signed    PCN/MedQ  DD: 01/26/2007  DT: 01/27/2007  Job #: [id number]   cc:   Tinnie Gens A. Tawanna Cooler, MD

## 2010-09-27 ENCOUNTER — Other Ambulatory Visit: Payer: Self-pay | Admitting: *Deleted

## 2010-09-27 DIAGNOSIS — I10 Essential (primary) hypertension: Secondary | ICD-10-CM

## 2010-09-27 MED ORDER — AMLODIPINE BESY-BENAZEPRIL HCL 5-20 MG PO CAPS: 1.0000 | ORAL_CAPSULE | Freq: Every day | ORAL | Status: DC

## 2010-12-08 LAB — HM DIABETES EYE EXAM

## 2010-12-09 ENCOUNTER — Encounter: Payer: Self-pay | Admitting: Family Medicine

## 2011-02-21 ENCOUNTER — Other Ambulatory Visit: Payer: Self-pay | Admitting: *Deleted

## 2011-02-21 DIAGNOSIS — I10 Essential (primary) hypertension: Secondary | ICD-10-CM

## 2011-02-21 MED ORDER — AMLODIPINE BESY-BENAZEPRIL HCL 5-20 MG PO CAPS: 1.0000 | ORAL_CAPSULE | Freq: Every day | ORAL | Status: DC

## 2011-02-21 MED ORDER — GLIPIZIDE ER 5 MG PO TB24: 5.0000 mg | ORAL_TABLET | Freq: Two times a day (BID) | ORAL | Status: DC

## 2011-02-28 ENCOUNTER — Inpatient Hospital Stay (HOSPITAL_COMMUNITY)
Admission: EM | Admit: 2011-02-28 | Discharge: 2011-03-01 | DRG: 092 | Disposition: A | Discharge status: Home / Self Care | Payer: Medicare Other | Attending: Internal Medicine | Admitting: Internal Medicine

## 2011-02-28 ENCOUNTER — Other Ambulatory Visit: Payer: Self-pay

## 2011-02-28 ENCOUNTER — Inpatient Hospital Stay (HOSPITAL_COMMUNITY): Payer: Medicare Other

## 2011-02-28 ENCOUNTER — Emergency Department (HOSPITAL_COMMUNITY): Payer: Medicare Other

## 2011-02-28 DIAGNOSIS — IMO0002 Reserved for concepts with insufficient information to code with codable children: Secondary | ICD-10-CM | POA: Diagnosis present

## 2011-02-28 DIAGNOSIS — E785 Hyperlipidemia, unspecified: Secondary | ICD-10-CM | POA: Diagnosis present

## 2011-02-28 DIAGNOSIS — E119 Type 2 diabetes mellitus without complications: Secondary | ICD-10-CM | POA: Diagnosis present

## 2011-02-28 DIAGNOSIS — R531 Weakness: Secondary | ICD-10-CM

## 2011-02-28 DIAGNOSIS — E876 Hypokalemia: Secondary | ICD-10-CM | POA: Diagnosis present

## 2011-02-28 DIAGNOSIS — I10 Essential (primary) hypertension: Secondary | ICD-10-CM | POA: Diagnosis present

## 2011-02-28 DIAGNOSIS — R42 Dizziness and giddiness: Secondary | ICD-10-CM

## 2011-02-28 DIAGNOSIS — E1165 Type 2 diabetes mellitus with hyperglycemia: Secondary | ICD-10-CM | POA: Diagnosis present

## 2011-02-28 DIAGNOSIS — R8290 Unspecified abnormal findings in urine: Secondary | ICD-10-CM | POA: Diagnosis present

## 2011-02-28 DIAGNOSIS — I517 Cardiomegaly: Secondary | ICD-10-CM | POA: Diagnosis present

## 2011-02-28 DIAGNOSIS — R0989 Other specified symptoms and signs involving the circulatory and respiratory systems: Secondary | ICD-10-CM

## 2011-02-28 DIAGNOSIS — R202 Paresthesia of skin: Secondary | ICD-10-CM | POA: Diagnosis present

## 2011-02-28 DIAGNOSIS — R209 Unspecified disturbances of skin sensation: Principal | ICD-10-CM | POA: Diagnosis present

## 2011-02-28 DIAGNOSIS — N390 Urinary tract infection, site not specified: Secondary | ICD-10-CM | POA: Diagnosis present

## 2011-02-28 HISTORY — DX: Pure hypercholesterolemia, unspecified: E78.00

## 2011-02-28 HISTORY — DX: Essential (primary) hypertension: I10

## 2011-02-28 LAB — BASIC METABOLIC PANEL
BUN: 13 mg/dL (ref 6–23)
CO2: 26 mEq/L (ref 19–32)
Calcium: 9.6 mg/dL (ref 8.4–10.5)
Creatinine, Ser: 0.65 mg/dL (ref 0.50–1.10)
Glucose, Bld: 252 mg/dL — ABNORMAL HIGH (ref 70–99)

## 2011-02-28 LAB — GLUCOSE, CAPILLARY: Glucose-Capillary: 195 mg/dL — ABNORMAL HIGH (ref 70–99)

## 2011-02-28 LAB — URINALYSIS, ROUTINE W REFLEX MICROSCOPIC
Bilirubin Urine: NEGATIVE
Hgb urine dipstick: NEGATIVE
Nitrite: NEGATIVE
Urobilinogen, UA: 1 mg/dL (ref 0.0–1.0)

## 2011-02-28 LAB — URINE MICROSCOPIC-ADD ON

## 2011-02-28 LAB — CBC
HCT: 40.1 % (ref 36.0–46.0)
MCH: 30.8 pg (ref 26.0–34.0)
MCHC: 34.7 g/dL (ref 30.0–36.0)
MCV: 88.9 fL (ref 78.0–100.0)
Platelets: 334 10*3/uL (ref 150–400)
RDW: 13.1 % (ref 11.5–15.5)

## 2011-02-28 LAB — CARDIAC PANEL(CRET KIN+CKTOT+MB+TROPI)
Relative Index: INVALID (ref 0.0–2.5)
Total CK: 69 U/L (ref 7–177)

## 2011-02-28 MED ORDER — ACETAMINOPHEN 325 MG PO TABS: 650.0000 mg | ORAL_TABLET | ORAL | Status: DC | PRN

## 2011-02-28 MED ORDER — ASPIRIN 81 MG PO CHEW
324.0000 mg | CHEWABLE_TABLET | Freq: Once | ORAL | Status: DC
  Filled 2011-02-28: qty 1

## 2011-02-28 MED ORDER — LORAZEPAM 2 MG/ML PO CONC: 0.5000 mg | Freq: Once | ORAL | Status: DC | PRN

## 2011-02-28 MED ORDER — SENNOSIDES-DOCUSATE SODIUM 8.6-50 MG PO TABS
1.0000 | ORAL_TABLET | Freq: Every evening | ORAL | Status: DC | PRN
  Filled 2011-02-28: qty 1

## 2011-02-28 MED ORDER — HYDRALAZINE HCL 20 MG/ML IJ SOLN: 10.0000 mg | Freq: Four times a day (QID) | INTRAMUSCULAR | Status: DC | PRN

## 2011-02-28 MED ORDER — ACETAMINOPHEN 650 MG RE SUPP: 650.0000 mg | RECTAL | Status: DC | PRN

## 2011-02-28 MED ORDER — INSULIN ASPART 100 UNIT/ML ~~LOC~~ SOLN
0.0000 [IU] | Freq: Three times a day (TID) | SUBCUTANEOUS | Status: DC
  Administered 2011-03-01: 3 [IU] via SUBCUTANEOUS
  Administered 2011-03-01: 2 [IU] via SUBCUTANEOUS
  Filled 2011-02-28: qty 3

## 2011-02-28 MED ORDER — LORAZEPAM 2 MG/ML PO CONC: 0.5000 mg | Freq: Once | ORAL | Status: DC

## 2011-02-28 MED ORDER — AMLODIPINE BESYLATE 5 MG PO TABS
5.0000 mg | ORAL_TABLET | Freq: Every day | ORAL | Status: DC
  Administered 2011-03-01: 5 mg via ORAL
  Filled 2011-02-28: qty 1

## 2011-02-28 MED ORDER — LORAZEPAM 2 MG/ML IJ SOLN
0.5000 mg | Freq: Once | INTRAMUSCULAR | Status: AC
  Administered 2011-02-28: 0.5 mg via INTRAVENOUS
  Filled 2011-02-28: qty 1

## 2011-02-28 MED ORDER — ENOXAPARIN SODIUM 40 MG/0.4ML ~~LOC~~ SOLN
40.0000 mg | SUBCUTANEOUS | Status: DC
  Administered 2011-03-01: 40 mg via SUBCUTANEOUS
  Filled 2011-02-28 (×2): qty 0.4

## 2011-02-28 MED ORDER — ASPIRIN 325 MG PO TABS
325.0000 mg | ORAL_TABLET | Freq: Every day | ORAL | Status: DC
  Administered 2011-03-01: 325 mg via ORAL
  Filled 2011-02-28 (×2): qty 1

## 2011-02-28 MED ORDER — DEXTROSE 5 % IV SOLN
1.0000 g | INTRAVENOUS | Status: DC
  Administered 2011-02-28: 1 g via INTRAVENOUS
  Filled 2011-02-28 (×2): qty 10

## 2011-02-28 MED ORDER — INSULIN ASPART 100 UNIT/ML ~~LOC~~ SOLN: 0.0000 [IU] | Freq: Every day | SUBCUTANEOUS | Status: DC

## 2011-02-28 MED ORDER — ASPIRIN 300 MG RE SUPP
300.0000 mg | Freq: Every day | RECTAL | Status: DC
  Filled 2011-02-28 (×2): qty 1

## 2011-02-28 MED ORDER — SODIUM CHLORIDE 0.9 % IJ SOLN
3.0000 mL | Freq: Two times a day (BID) | INTRAMUSCULAR | Status: DC
  Administered 2011-03-01: 3 mL via INTRAVENOUS

## 2011-02-28 MED ORDER — POTASSIUM CHLORIDE 10 MEQ/100ML IV SOLN
10.0000 meq | INTRAVENOUS | Status: AC
  Administered 2011-03-01 (×2): 10 meq via INTRAVENOUS
  Filled 2011-02-28 (×2): qty 100

## 2011-02-28 MED ORDER — ONDANSETRON HCL 4 MG/2ML IJ SOLN: 4.0000 mg | Freq: Four times a day (QID) | INTRAMUSCULAR | Status: DC | PRN

## 2011-02-28 MED ORDER — SODIUM CHLORIDE 0.9 % IV BOLUS (SEPSIS)
500.0000 mL | Freq: Once | INTRAVENOUS | Status: AC
  Administered 2011-02-28: 500 mL via INTRAVENOUS

## 2011-02-28 MED ORDER — LORAZEPAM 2 MG/ML IJ SOLN: 0.5000 mg | Freq: Once | INTRAMUSCULAR | Status: AC | PRN

## 2011-02-28 NOTE — ED Provider Notes (Signed)
History     CSN: [id number]  Arrival date & time 02/28/11  0906   First MD Initiated Contact with Patient 02/28/11 [phone]      Chief Complaint  Patient presents with  . Extremity Weakness  . Dizziness    (Consider location/radiation/quality/duration/timing/severity/associated sxs/prior treatment) Patient is a 70 y.o. female presenting with extremity weakness. The history is provided by the patient.  Extremity Weakness This is a new problem. The current episode started more than 2 days ago. The problem has been gradually worsening. Pertinent negatives include no chest pain, no abdominal pain, no headaches and no shortness of breath. The symptoms are relieved by nothing.   Pt presents with 1 week of L hand numbness that is intermittent worsening this morning upon waking at 0530. Also states she felt dizzy like the room was spinning worse with sitting up in bed. She had mild nausea at the time which is resolved. Though she feel generalized weakness there is no focal area of weakness. She denies HA, fever, CP, SOB, cough, abd pain  Past Medical History  Diagnosis Date  . Diabetes mellitus   . Hypertension   . High cholesterol     History reviewed. No pertinent past surgical history.  History reviewed. No pertinent family history.  History  Substance Use Topics  . Smoking status: Not on file  . Smokeless tobacco: Not on file  . Alcohol Use: No    OB History    Grav Para Term Preterm Abortions TAB SAB Ect Mult Living                  Review of Systems  Constitutional: Negative for fever, chills and diaphoresis.  HENT: Negative for congestion, neck pain and neck stiffness.   Eyes: Negative for visual disturbance.  Respiratory: Negative for cough, chest tightness, shortness of breath and wheezing.   Cardiovascular: Negative for chest pain and leg swelling.  Gastrointestinal: Positive for nausea. Negative for vomiting, abdominal pain, diarrhea, constipation and abdominal  distention.  Genitourinary: Negative for dysuria.  Musculoskeletal: Positive for gait problem and extremity weakness. Negative for myalgias.  Skin: Negative for rash and wound.  Neurological: Positive for dizziness, weakness and numbness. Negative for syncope, facial asymmetry, speech difficulty, light-headedness and headaches.    Allergies  Sulfonamide derivatives  Home Medications   Current Outpatient Rx  Name Route Sig Dispense Refill  . AMLODIPINE BESY-BENAZEPRIL HCL 5-20 MG PO CAPS Oral Take 1 capsule by mouth daily. 90 capsule 1  . GLIPIZIDE ER 5 MG PO TB24 Oral Take 1 tablet (5 mg total) by mouth 2 (two) times daily. 180 tablet 1    BP 165/83  Pulse 80  Temp(Src) 98.9 F (37.2 C) (Oral)  Resp 18  SpO2 100%  Physical Exam  Nursing note and vitals reviewed. Constitutional: She is oriented to person, place, and time. She appears well-developed and well-nourished. No distress.  HENT:  Head: Normocephalic and atraumatic.  Mouth/Throat: Oropharynx is clear and moist.  Eyes: EOM are normal. Pupils are equal, round, and reactive to light.  Neck: Normal range of motion. Neck supple.  Cardiovascular: Normal rate and regular rhythm.   Pulmonary/Chest: Effort normal and breath sounds normal. No respiratory distress. She has no wheezes. She has no rales.  Abdominal: Soft. Bowel sounds are normal.  Musculoskeletal: Normal range of motion. She exhibits no edema and no tenderness.  Neurological: She is alert and oriented to person, place, and time. No cranial nerve deficit. Coordination normal.  4/5 strength in bl upper ext. 5/5 strength in bl lower ext. Mild decreased sensation over dorsum of L hand. Finger to nose intact  Skin: Skin is warm and dry. No rash noted. No erythema.  Psychiatric: She has a normal mood and affect. Her behavior is normal.    ED Course  Procedures (including critical care time)  Labs Reviewed  BASIC METABOLIC PANEL - Abnormal; Notable for the  following:    Potassium 3.3 (*)    Glucose, Bld 252 (*)    GFR calc non Af Amer 88 (*)    All other components within normal limits  CBC  POCT CBG MONITORING  CARDIAC PANEL(CRET KIN+CKTOT+MB+TROPI)  URINALYSIS, ROUTINE W REFLEX MICROSCOPIC  APTT  PROTIME-INR   Ct Head Wo Contrast  02/28/2011  *RADIOLOGY REPORT*  Clinical Data: Dizziness and weakness.  Numbness on the left  CT HEAD WITHOUT CONTRAST  Technique:  Contiguous axial images were obtained from the base of the skull through the vertex without contrast.  Comparison: 03/14/2006  Findings: The brain shows mild age related atrophy. There are mild chronic small vessel changes of the hemispheric white matter. There is a new area of low density in the subcortical white matter of the right frontoparietal junction region that could be a recent infarction, though the finding is age indeterminate.  No evidence of swelling, hemorrhage or mass effect.  No evidence of mass lesion, hydrocephalus or extra-axial collection.  No intracranial hemorrhage.  The calvarium is unremarkable.  Sinuses do not show any significant disease.  IMPRESSION: Mild age related atrophy and chronic small vessel disease.  Small area of low density in the subcortical white matter of the right frontoparietal region, not seen on the study 2008.  This could be an old white matter infarction or could possibly be a recent infarction.  Original Report Authenticated By: Thomasenia Sales, M.D.    Date: 02/28/2011  Rate: 76    Rhythm: normal sinus rhythm  QRS Axis: left  Intervals: normal  ST/T Wave abnormalities: nonspecific ST changes  Conduction Disutrbances:none  Narrative Interpretation:   Old EKG Reviewed: none available    No diagnosis found.    MDM  Concern given age and complaints for non-acute stroke. Will r/o other causes of symptoms. Likely will need admission for further work up        Loren Racer, MD 03/01/11 [phone]

## 2011-02-28 NOTE — Progress Notes (Signed)
*  PRELIMINARY RESULTS*  Carotid duplex has been performed.  Bilateral:  No evidence of hemodynamically significant internal carotid artery stenosis.   Vertebral artery flow is antegrade.      Vanna Scotland 02/28/2011, 4:19 PM

## 2011-02-28 NOTE — ED Notes (Signed)
Pt has equal grips but they are weak, daughter states that she noticed that she was weak and shaking this am

## 2011-02-28 NOTE — H&P (Signed)
Amanda Conway is an 71 y.o. female.    PCP: Evette Georges, MD   Chief Complaint: Left-sided numbness  HPI: This is a 71 year old, African American female, with a past medical history of hypertension, diabetes, high cholesterol, who was in her usual state of health till about a week ago, when she started noticing that she had numbness in the left hand. She noted these symptoms this morning when she woke up early. She had significant dizziness. Denies any syncopal episode. She said she almost passed out this morning. Denies any headache or any visual changes. And, then she also noticed that the left side of her body was numb. This is more so in the third, fourth and fifth fingers on the left hand and left leg. Denies any chest pain or shortness of breath. Denies any weakness on the left side in terms of strength. Denies any seizure-type activity. Never had similar complaints in the past. No fecal or bladder incontinence. She did report one episode of soft stool this morning, but none since then.   Prior to Admission medications   Medication Sig Start Date End Date Taking? Authorizing Provider  amLODipine-benazepril (LOTREL) 5-20 MG per capsule Take 1 capsule by mouth daily. 02/21/11 02/21/12 Yes Gaetano Net Todd  aspirin 81 MG tablet Take 81 mg by mouth daily.     Yes Historical Provider, MD  glipiZIDE (GLIPIZIDE XL) 5 MG 24 hr tablet Take 1 tablet (5 mg total) by mouth 2 (two) times daily. 02/21/11 02/21/12 Yes Evette Georges    Allergies:  Allergies  Allergen Reactions  . Banana   . Corn-Containing Products   . Fish Allergy   . Sulfonamide Derivatives     REACTION: family hx    Past Medical History  Diagnosis Date  . Diabetes mellitus   . Hypertension   . High cholesterol     Past Surgical History  Procedure Date  . Bilateral rotator cuff repair     Social History:  reports that she quit smoking about 40 years ago. Her smoking use included Cigarettes. She does not  have any smokeless tobacco history on file. She reports that she does not drink alcohol or use illicit drugs.  Family History:  Family History  Problem Relation Age of Onset  . Diabetes Mother   . Cancer Father     Review of Systems - History obtained from the patient General ROS: positive for  - fatigue Psychological ROS: negative Ophthalmic ROS: negative ENT ROS: negative Allergy and Immunology ROS: negative Hematological and Lymphatic ROS: negative Endocrine ROS: negative Respiratory ROS: no cough, shortness of breath, or wheezing Cardiovascular ROS: no chest pain or dyspnea on exertion Gastrointestinal ROS: as in hpi Genito-Urinary ROS: negative Musculoskeletal ROS: negative Neurological ROS: as in hpi Dermatological ROS: negative  Physical Examination Blood pressure 147/83, pulse 63, temperature 98.1 F (36.7 C), temperature source Oral, resp. rate 16, SpO2 100.00%.  General appearance: alert, cooperative, appears stated age and no distress Head: Normocephalic, without obvious abnormality, atraumatic Eyes: conjunctivae/corneas clear. PERRL, EOM's intact.  Throat: lips, mucosa, and tongue normal; dentures and gums normal Neck: no adenopathy, no carotid bruit, no JVD, supple, symmetrical, trachea midline and thyroid not enlarged, symmetric, no tenderness/mass/nodules Back: symmetric, no curvature. ROM normal. No CVA tenderness. Resp: clear to auscultation bilaterally Cardio: regular rate and rhythm, S1, S2 normal, no murmur, click, rub or gallop GI: soft, non-tender; bowel sounds normal; no masses,  no organomegaly Extremities: extremities normal, atraumatic, no cyanosis or edema Pulses:  2+ and symmetric Skin: Skin color, texture, turgor normal. No rashes or lesions Lymph nodes: Cervical, supraclavicular, and axillary nodes normal. Neurologic: Mental status: Alert, oriented, thought content appropriate Cranial nerves: normal Sensory: normal Motor: grossly  normal Reflexes: 2+ and symmetric  Results for orders placed during the hospital encounter of 02/28/11 (from the past 48 hour(s))  GLUCOSE, CAPILLARY     Status: Abnormal   Collection Time   02/28/11  9:25 AM      Component Value Range Comment   Glucose-Capillary 240 (*) 70 - 99 (mg/dL)   CARDIAC PANEL(CRET KIN+CKTOT+MB+TROPI)     Status: Normal   Collection Time   02/28/11 10:00 AM      Component Value Range Comment   Total CK 69  7 - 177 (U/L)    CK, MB 2.6  0.3 - 4.0 (ng/mL)    Troponin I <0.30  <0.30 (ng/mL)    Relative Index RELATIVE INDEX IS INVALID  0.0 - 2.5    BASIC METABOLIC PANEL     Status: Abnormal   Collection Time   02/28/11 10:00 AM      Component Value Range Comment   Sodium 136  135 - 145 (mEq/L)    Potassium 3.3 (*) 3.5 - 5.1 (mEq/L)    Chloride 99  96 - 112 (mEq/L)    CO2 26  19 - 32 (mEq/L)    Glucose, Bld 252 (*) 70 - 99 (mg/dL)    BUN 13  6 - 23 (mg/dL)    Creatinine, Ser 1.61  0.50 - 1.10 (mg/dL)    Calcium 9.6  8.4 - 10.5 (mg/dL)    GFR calc non Af Amer 88 (*) >90 (mL/min)    GFR calc Af Amer >90  >90 (mL/min)   CBC     Status: Normal   Collection Time   02/28/11 10:00 AM      Component Value Range Comment   WBC 6.6  4.0 - 10.5 (K/uL)    RBC 4.51  3.87 - 5.11 (MIL/uL)    Hemoglobin 13.9  12.0 - 15.0 (g/dL)    HCT 09.6  04.5 - 40.9 (%)    MCV 88.9  78.0 - 100.0 (fL)    MCH 30.8  26.0 - 34.0 (pg)    MCHC 34.7  30.0 - 36.0 (g/dL)    RDW 81.1  91.4 - 78.2 (%)    Platelets 334  150 - 400 (K/uL)   APTT     Status: Normal   Collection Time   02/28/11 10:00 AM      Component Value Range Comment   aPTT 29  24 - 37 (seconds)   PROTIME-INR     Status: Normal   Collection Time   02/28/11 10:00 AM      Component Value Range Comment   Prothrombin Time 12.2  11.6 - 15.2 (seconds)    INR 0.89  0.00 - 1.49    URINALYSIS, ROUTINE W REFLEX MICROSCOPIC     Status: Abnormal   Collection Time   02/28/11 11:04 AM      Component Value Range Comment   Color, Urine YELLOW   YELLOW     APPearance CLEAR  CLEAR     Specific Gravity, Urine 1.020  1.005 - 1.030     pH 7.0  5.0 - 8.0     Glucose, UA >1000 (*) NEGATIVE (mg/dL)    Hgb urine dipstick NEGATIVE  NEGATIVE     Bilirubin Urine NEGATIVE  NEGATIVE  Ketones, ur TRACE (*) NEGATIVE (mg/dL)    Protein, ur NEGATIVE  NEGATIVE (mg/dL)    Urobilinogen, UA 1.0  0.0 - 1.0 (mg/dL)    Nitrite NEGATIVE  NEGATIVE     Leukocytes, UA SMALL (*) NEGATIVE    URINE MICROSCOPIC-ADD ON     Status: Abnormal   Collection Time   02/28/11 11:04 AM      Component Value Range Comment   Squamous Epithelial / LPF FEW (*) RARE     WBC, UA 3-6  <3 (WBC/hpf)    Bacteria, UA FEW (*) RARE     Urine-Other MUCOUS PRESENT     GLUCOSE, CAPILLARY     Status: Abnormal   Collection Time   02/28/11  2:06 PM      Component Value Range Comment   Glucose-Capillary 195 (*) 70 - 99 (mg/dL)    Dg Chest 2 View  03/01/1476  *RADIOLOGY REPORT*  Clinical Data: Fall.  Weakness.  CHEST - 2 VIEW  Comparison: None.  Findings: The heart is moderately enlarged.  Lungs are clear. Normal pulmonary vascularity.  No pneumothorax or pleural effusion. Osteopenia. Moderate lower thoracic dextroscoliosis.  IMPRESSION: Cardiomegaly without edema.  Original Report Authenticated By: Donavan Burnet, M.D.   Dg Wrist Complete Left  02/28/2011  *RADIOLOGY REPORT*  Clinical Data: Dizziness and weakness.  Left hand and arm numbness.  LEFT WRIST - COMPLETE 3+ VIEW  Comparison: Plain films of the left wrist 03/14/2006.  Findings: No acute bony or joint abnormality is identified. Nondisplaced distal radius fracture seen on the prior study has healed.  The patient has some degenerative disease at the base of the thumb which appears mild to moderate.  IMPRESSION:  1.  No acute finding. 2.  Healed distal radius fracture. 3.  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  Original Report Authenticated By: Bernadene Bell. Maricela Curet, M.D.   Ct Head Wo Contrast  02/28/2011  *RADIOLOGY REPORT*   Clinical Data: Dizziness and weakness.  Numbness on the left  CT HEAD WITHOUT CONTRAST  Technique:  Contiguous axial images were obtained from the base of the skull through the vertex without contrast.  Comparison: 03/14/2006  Findings: The brain shows mild age related atrophy. There are mild chronic small vessel changes of the hemispheric white matter. There is a new area of low density in the subcortical white matter of the right frontoparietal junction region that could be a recent infarction, though the finding is age indeterminate.  No evidence of swelling, hemorrhage or mass effect.  No evidence of mass lesion, hydrocephalus or extra-axial collection.  No intracranial hemorrhage.  The calvarium is unremarkable.  Sinuses do not show any significant disease.  IMPRESSION: Mild age related atrophy and chronic small vessel disease.  Small area of low density in the subcortical white matter of the right frontoparietal region, not seen on the study 2008.  This could be an old white matter infarction or could possibly be a recent infarction.  Original Report Authenticated By: Thomasenia Sales, M.D.   EKG not found but reported as being NSR by EDP with non-specific t wave changes..  Assessment/Plan  Principal Problem:  *Hemiparesthesia Active Problems:  HYPERLIPIDEMIA  HYPERTENSION  DM (diabetes mellitus), type 2, uncontrolled  Hypokalemia  Abnormal urinalysis   #1 left hemiparesthesia: This is most likely secondary to a stroke. Stroke workup will be initiated. Patient will be maintained, for now, on aspirin. Since she does take baby aspirin every day and so if a stroke is confirmed  alternative agents may have to be considered. Statin medication will also be initiated once stroke is confirmed.  #2 history of hypertension. Will allow permissive hypertension for now. We'll initiate her home medications from tomorrow.  #3 diabetes, uncontrolled, type II: Will check HbA1c. Sliding scale insulin will be  prescribed.  #4 history of hyperlipidemia: Patient does not take any medications for this at this time. She reports that when she used to take a statin medication every evening she had difficulty sleeping and, so, she stopped taking it. Does not report any kind of allergic reaction or other side effects from these medications.  #5 hypokalemia: This will be repleted.  #6 abnormal, UA: We will initiate ceftriaxone. Urine for cultures will be sent as well.  She is noted to have cardiomegaly on chest x-ray and so, echocardiogram will be ordered  DVT, prophylaxis will be initiated.  Patient is a full code.  Further management decisions will depend on results of further testing and patient's response to treatment.  [facility]  [facility] Pager [phone]  02/28/2011, 5:21 PM

## 2011-02-28 NOTE — Progress Notes (Signed)
02/28/11 2310- Completed stroke swallow screen with pt. Pt passed with clear lungs all fields before and after test. Written form for swallow screen placed in progress notes section of shadow chart. Reported to pt's nurse that pt passed and was ready to eat according to MD orders. Lorelle Formosa RN

## 2011-02-28 NOTE — ED Notes (Signed)
Pt states that she noticed her left arm being numb and weak last week, then it went away, this am she states that she feels dizzy, weak and her extremites are numb

## 2011-03-01 LAB — LIPID PANEL
Cholesterol: 237 mg/dL — ABNORMAL HIGH (ref 0–200)
LDL Cholesterol: 163 mg/dL — ABNORMAL HIGH (ref 0–99)
Triglycerides: 112 mg/dL (ref <150)

## 2011-03-01 LAB — CBC
HCT: 34.2 % — ABNORMAL LOW (ref 36.0–46.0)
Hemoglobin: 11.7 g/dL — ABNORMAL LOW (ref 12.0–15.0)
MCHC: 34.2 g/dL (ref 30.0–36.0)
RBC: 3.8 MIL/uL — ABNORMAL LOW (ref 3.87–5.11)
WBC: 6 10*3/uL (ref 4.0–10.5)

## 2011-03-01 LAB — GLUCOSE, CAPILLARY
Glucose-Capillary: 198 mg/dL — ABNORMAL HIGH (ref 70–99)
Glucose-Capillary: 136 mg/dL — ABNORMAL HIGH (ref 70–99)

## 2011-03-01 LAB — CARDIAC PANEL(CRET KIN+CKTOT+MB+TROPI)
CK, MB: 2 ng/mL (ref 0.3–4.0)
Relative Index: INVALID (ref 0.0–2.5)
Relative Index: INVALID (ref 0.0–2.5)
Total CK: 43 U/L (ref 7–177)
Troponin I: <0.3 ng/mL (ref <0.30)
Troponin I: <0.3 ng/mL (ref <0.30)

## 2011-03-01 LAB — BASIC METABOLIC PANEL
BUN: 12 mg/dL (ref 6–23)
Chloride: 105 mEq/L (ref 96–112)
GFR calc Af Amer: >90 mL/min (ref >90)
Potassium: 3.8 mEq/L (ref 3.5–5.1)

## 2011-03-01 LAB — HEMOGLOBIN A1C: Hgb A1c MFr Bld: 9.1 % — ABNORMAL HIGH (ref <5.7)

## 2011-03-01 MED ORDER — LIVING WELL WITH DIABETES BOOK
Freq: Once | Status: AC
  Administered 2011-03-01: 16:00:00
  Filled 2011-03-01: qty 1

## 2011-03-01 MED ORDER — CEPHALEXIN 500 MG PO CAPS: 500.0000 mg | ORAL_CAPSULE | Freq: Three times a day (TID) | ORAL | Status: AC

## 2011-03-01 MED ORDER — LIVING WELL WITH DIABETES BOOK: 1.0000 | Freq: Once | Status: DC

## 2011-03-01 MED ORDER — GLIPIZIDE ER 10 MG PO TB24: 10.0000 mg | ORAL_TABLET | Freq: Two times a day (BID) | ORAL | Status: DC

## 2011-03-01 NOTE — Progress Notes (Signed)
*  PRELIMINARY RESULTS* Echocardiogram 2D Echocardiogram has been performed.  Glean Salen [facility] 03/01/2011, 12:36 PM

## 2011-03-01 NOTE — Discharge Summary (Signed)
Physician Discharge Summary  Patient ID: Amanda Conway MRN: [id number] DOB/AGE: [birth year] 71 y.o.  Admit date: 02/28/2011 Discharge date: 03/01/2011  Discharge Diagnoses:  Principal Problem:  *Hemiparesthesia Active Problems:  HYPERLIPIDEMIA  HYPERTENSION  DM (diabetes mellitus), type 2, uncontrolled  Hypokalemia  Abnormal urinalysis   Discharged Condition: good  Initial History: This is a 71 year old, African American female, with a past medical history of hypertension, diabetes, high cholesterol, who was in her usual state of health till about a week ago, when she started noticing that she had numbness in the left hand. She noted these symptoms on the morning of admission when she woke up early. She had significant dizziness. Denies any syncopal episode. She said she almost passed out. Denies any headache or any visual changes. And, then she also noticed that the left side of her body was numb. This is more so in the third, fourth and fifth fingers on the left hand and left leg. Denies any chest pain or shortness of breath. Denies any weakness on the left side in terms of strength. Denies any seizure-type activity. Never had similar complaints in the past. No fecal or bladder incontinence. She did report one episode of soft stool this morning, but none since then.   Hospital Course:   #1 left hemiparesthesia: Surprisingly the MRI did not reveal any acute strokes. Patient denies any neck pains. She did start working again recently, however, denies using the keyboard that frequently. Symptoms as of this morning have resolved. So, the reason for her hemiparesthesia is not entirely clear. She's been seen by physical therapy and has been cleared by them. So, the patient has not had a stroke. She will need to followup with her primary care physician for further evaluation of the same.  #2. Cardiomegaly: Carrick enzymes have been negative. She is not short of breath, and doesn't have any  chest pain. EKG did show a T inversion in lead 3 along with the Q-wave. I would recommend that her PCP followup on the results of the echocardiogram and consider referral to a cardiologist. Patient did report a negative stress test 2 years ago.  #3 poorly controlled diabetes. Her HbA1c is 9.1. I will recommend to her to increase the dose of glipizide at home. Have also recommended to check her blood sugars at home frequently and to maintain a record for her PCP. Further management as directed by her primary care physician.  #4 history of hypertension: Blood pressure stable this morning. May resume home medication.  #5 hyperlipidemia: Since there is no stroke on there is no absolute need to initiate treatment at this time. When I spoke to the patient yesterday she was very reluctant about initiating treatment. Her total cholesterol is 237 and her LDL is 163. Her HDL is 52. Triglycerides 112. I would recommend that the patient discuss this further with her primary care physician.  #6 Abnormal UA suggesting UTI: She received a dose of ceftriaxone. She can complete the course with Keflex.  So, overall, patient is stable. She does live alone, however, does have good support system. And, also she has been cleared by physical therapy. She's keen on going home and is stable for discharge.  Echocardiogram will be done later today. No need to wait for the results prior to discharge. PCP to follow up on results. She will also be seen by a dietitian before discharge.  PERTINENT LABS  See above   IMAGING STUDIES Dg Chest 2 View  02/28/2011  *RADIOLOGY REPORT*  Clinical Data: Fall.  Weakness.  CHEST - 2 VIEW  Comparison: None.  Findings: The heart is moderately enlarged.  Lungs are clear. Normal pulmonary vascularity.  No pneumothorax or pleural effusion. Osteopenia. Moderate lower thoracic dextroscoliosis.  IMPRESSION: Cardiomegaly without edema.  Original Report Authenticated By: Donavan Burnet, M.D.   Dg  Wrist Complete Left  02/28/2011  *RADIOLOGY REPORT*  Clinical Data: Dizziness and weakness.  Left hand and arm numbness.  LEFT WRIST - COMPLETE 3+ VIEW  Comparison: Plain films of the left wrist 03/14/2006.  Findings: No acute bony or joint abnormality is identified. Nondisplaced distal radius fracture seen on the prior study has healed.  The patient has some degenerative disease at the base of the thumb which appears mild to moderate.  IMPRESSION:  1.  No acute finding. 2.  Healed distal radius fracture. 3.  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  Original Report Authenticated By: Bernadene Bell. Maricela Curet, M.D.   Ct Head Wo Contrast  02/28/2011  *RADIOLOGY REPORT*  Clinical Data: Dizziness and weakness.  Numbness on the left  CT HEAD WITHOUT CONTRAST  Technique:  Contiguous axial images were obtained from the base of the skull through the vertex without contrast.  Comparison: 03/14/2006  Findings: The brain shows mild age related atrophy. There are mild chronic small vessel changes of the hemispheric white matter. There is a new area of low density in the subcortical white matter of the right frontoparietal junction region that could be a recent infarction, though the finding is age indeterminate.  No evidence of swelling, hemorrhage or mass effect.  No evidence of mass lesion, hydrocephalus or extra-axial collection.  No intracranial hemorrhage.  The calvarium is unremarkable.  Sinuses do not show any significant disease.  IMPRESSION: Mild age related atrophy and chronic small vessel disease.  Small area of low density in the subcortical white matter of the right frontoparietal region, not seen on the study 2008.  This could be an old white matter infarction or could possibly be a recent infarction.  Original Report Authenticated By: Thomasenia Sales, M.D.   Mr Maxine Glenn Head Wo Contrast  02/28/2011  *RADIOLOGY REPORT*  Clinical Data:  Left sided numbness.  Dizziness.  Weakness.  High blood pressure.  MRI HEAD  WITHOUT CONTRAST MRA HEAD WITHOUT CONTRAST  Technique: Multiplanar, multiecho pulse sequences of the brain and surrounding structures were obtained according to standard protocol without intravenous contrast.  Angiographic images of the head were obtained using MRA technique without contrast.  Comparison: 02/28/2011 head CT.  MRI HEAD  Findings:  No acute infarct.  No intracranial hemorrhage.  Mild to moderate small vessel disease type changes.  No intracranial mass lesion detected on this unenhanced motion degraded exam.  Mild atrophy without hydrocephalus.  Mucosal thickening/partial opacification paranasal sinuses and inferior left mastoid air cells.  Ectatic basilar artery causes impression and deformity of the hypothalamic region.  Major intracranial vascular structures are patent.  IMPRESSION: No acute infarct.  Mild to moderate small vessel disease type changes.  Mild atrophy without hydrocephalus.  Paranasal sinus mild mucosal thickening/partial opacification.  MRA HEAD  Findings: Mild narrowing of proximal cavernous segment of the left internal carotid artery.  Post narrowing dilation.  Overall, anterior circulation without medium or large size vessel significant stenosis or occlusion.  Middle cerebral artery and anterior cerebral artery branch vessel irregularity more notable involving the left middle cerebral artery branch vessels.  Ectatic vertebral arteries and basilar artery.  Mild narrowing of the vertebral arteries.  Non visualization PICAs.  Posterior cerebral artery branch vessel irregularity.  No aneurysm noted on this motion degraded exam.  IMPRESSION: Motion degraded examination with intracranial atherosclerotic type changes as noted above.  Original Report Authenticated By: Fuller Canada, M.D.   Mr Brain Wo Contrast  02/28/2011  *RADIOLOGY REPORT*  Clinical Data:  Left sided numbness.  Dizziness.  Weakness.  High blood pressure.  MRI HEAD WITHOUT CONTRAST MRA HEAD WITHOUT CONTRAST   Technique: Multiplanar, multiecho pulse sequences of the brain and surrounding structures were obtained according to standard protocol without intravenous contrast.  Angiographic images of the head were obtained using MRA technique without contrast.  Comparison: 02/28/2011 head CT.  MRI HEAD  Findings:  No acute infarct.  No intracranial hemorrhage.  Mild to moderate small vessel disease type changes.  No intracranial mass lesion detected on this unenhanced motion degraded exam.  Mild atrophy without hydrocephalus.  Mucosal thickening/partial opacification paranasal sinuses and inferior left mastoid air cells.  Ectatic basilar artery causes impression and deformity of the hypothalamic region.  Major intracranial vascular structures are patent.  IMPRESSION: No acute infarct.  Mild to moderate small vessel disease type changes.  Mild atrophy without hydrocephalus.  Paranasal sinus mild mucosal thickening/partial opacification.  MRA HEAD  Findings: Mild narrowing of proximal cavernous segment of the left internal carotid artery.  Post narrowing dilation.  Overall, anterior circulation without medium or large size vessel significant stenosis or occlusion.  Middle cerebral artery and anterior cerebral artery branch vessel irregularity more notable involving the left middle cerebral artery branch vessels.  Ectatic vertebral arteries and basilar artery.  Mild narrowing of the vertebral arteries.  Non visualization PICAs.  Posterior cerebral artery branch vessel irregularity.  No aneurysm noted on this motion degraded exam.  IMPRESSION: Motion degraded examination with intracranial atherosclerotic type changes as noted above.  Original Report Authenticated By: Fuller Canada, M.D.    Discharge Exam: Blood pressure 138/89, pulse 74, temperature 98.2 F (36.8 C), temperature source Oral, resp. rate 18, height 5\' 1"  (1.549 m), weight 70.67 kg (155 lb 12.8 oz), SpO2 96.00%. General appearance: alert, cooperative, appears  stated age, no distress and moderately obese Head: Normocephalic, without obvious abnormality, atraumatic Resp: clear to auscultation bilaterally Cardio: regular rate and rhythm, S1, S2 normal, no murmur, click, rub or gallop Neurologic: Grossly normal  Disposition: Home  Discharge Orders    Future Orders Please Complete By Expires   Diet Carb Modified      Increase activity slowly      Discharge instructions      Comments:   Check you blood glucose three times a day and maintain a log for your PCP. Call your doctor if sugar level is greater than 200 or less than 90.      Current Discharge Medication List    START taking these medications   Details  cephALEXin (KEFLEX) 500 MG capsule Take 1 capsule (500 mg total) by mouth 3 (three) times daily. For 6 days Qty: 18 capsule, Refills: 0      CONTINUE these medications which have CHANGED   Details  glipiZIDE (GLIPIZIDE XL) 10 MG 24 hr tablet Take 1 tablet (10 mg total) by mouth 2 (two) times daily. Qty: 60 tablet, Refills: 1   Associated Diagnoses: HTN (hypertension)      CONTINUE these medications which have NOT CHANGED   Details  amLODipine-benazepril (LOTREL) 5-20 MG per capsule Take 1 capsule by mouth daily. Qty: 90 capsule, Refills: 1   Associated Diagnoses:  HTN (hypertension)    aspirin 81 MG tablet Take 81 mg by mouth daily.         Follow-up Information    Follow up with TODD,JEFFREY ALLEN. Make an appointment in 1 week. (Follow up results of ECHO, post hopsitalization follow up. Management of DM2)          Total Discharge Time: 35 mins  [facility]  [facility] Pager [phone]  03/01/2011, 10:55 AM

## 2011-03-01 NOTE — Progress Notes (Signed)
Physical Therapy Evaluation Patient Details Name: Amanda Conway MRN: [id number] DOB: 20-Sep-1940 Today's Date: 03/01/2011 Time: 4098-1191  eval I Problem List:  Patient Active Problem List  Diagnoses  . HERPES ZOSTER, WITH NERVOUS SYSTEM COMPLICATION  . FUNGAL INFECTION  . DIABETES MELLITUS, TYPE II  . HYPERLIPIDEMIA  . HYPERTENSION  . CONTACT DERMATITIS&OTHER ECZEMA DUE TO PLANTS  . NONSPECIFIC ABNORMAL ELECTROCARDIOGRAM  . FOREIGN BODY, FOOT  . Hemiparesthesia  . DM (diabetes mellitus), type 2, uncontrolled  . Hypokalemia  . Abnormal urinalysis    Past Medical History:  Past Medical History  Diagnosis Date  . Diabetes mellitus   . Hypertension   . High cholesterol    Past Surgical History:  Past Surgical History  Procedure Date  . Bilateral rotator cuff repair     PT Assessment/Plan/Recommendation PT Assessment Clinical Impression Statement: Pt presents with diagnosis of L hemiparesthesia. Independent prior to admission. Pt feels she is near baseline and has no further needs for therapy. 1 x evaluation. PT will sign off.  PT Recommendation/Assessment: Patent does not need any further PT services No Skilled PT: Patient at baseline level of functioning;Patient is supervision for all activity/mobility (At baseline per patient) PT Recommendation Follow Up Recommendations: No PT follow up Equipment Recommended: None recommended by PT PT Goals     PT Evaluation Precautions/Restrictions    Prior Functioning  Home Living Lives With: Alone Type of Home: House Home Layout: Two level;Able to live on main level with bedroom/bathroom Alternate Level Stairs-Number of Steps: 1 flight Home Access: Stairs to enter Entrance Stairs-Rails: None Entrance Stairs-Number of Steps: 5 Home Adaptive Equipment: Walker - rolling;Straight cane;Crutches Additional Comments: pt has RW, cane (belonged to pt's mother) and crutches Prior Function Level of Independence: Independent with  basic ADLs;Independent with homemaking with ambulation;Independent with gait Driving: Yes Cognition Cognition Arousal/Alertness: Awake/alert Overall Cognitive Status: Appears within functional limits for tasks assessed Orientation Level: Oriented X4 Sensation/Coordination Sensation Light Touch: Appears Intact Coordination Gross Motor Movements are Fluid and Coordinated: Yes Extremity Assessment RLE Assessment RLE Assessment: Within Functional Limits LLE Assessment LLE Assessment: Within Functional Limits Mobility (including Balance) Bed Mobility Bed Mobility: Yes Supine to Sit: 6: Modified independent (Device/Increase time) Transfers Transfers: Yes Sit to Stand: 6: Modified independent (Device/Increase time) Stand to Sit: 6: Modified independent (Device/Increase time) Ambulation/Gait Ambulation/Gait: Yes Ambulation/Gait Assistance: 5: Supervision Ambulation/Gait Assistance Details (indicate cue type and reason): Intermittent wavering, no LOB.  Ambulation Distance (Feet): 400 Feet Assistive device: None Gait Pattern: Within Functional Limits Stairs: Yes Stairs Assistance: 5: Supervision Stairs Assistance Details (indicate cue type and reason): Pt reports some deficits with knees - "knees not the best." Stair Management Technique: One rail Left;Forwards;Alternating pattern Number of Stairs: 4   Posture/Postural Control Posture/Postural Control: No significant limitations Balance Balance Assessed: No (no LOB with activity) Exercise    End of Session PT - End of Session Equipment Utilized During Treatment: Gait belt Activity Tolerance: Patient tolerated treatment well Patient left: in chair;with call bell in reach Nurse Communication: Mobility status for ambulation;Mobility status for transfers General Behavior During Session: [facility] for tasks performed Cognition: [facility] for tasks performed  Rebeca Alert [facility] 03/01/2011, 9:35 AM [phone]

## 2011-03-01 NOTE — Progress Notes (Signed)
Just noted referral for OP education.  Again, will try to see patient before she leaves. Hopefully RD will be able to see patient as well for basic carb counting and portion control.

## 2011-03-01 NOTE — Progress Notes (Signed)
OT Screen:  Pt was screened for OT services.  No acute OT needs were identified. Pt feels like she is at baseline with ADLs and UE function.    Will sign off. [place], OTR/L [phone] 03/01/2011

## 2011-03-01 NOTE — Progress Notes (Signed)
  Speech Screen:  1032 Order for speech language evaluation received.  Pt seen briefly at bedside, pt oriented to person, place, time, situation.  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  Speech appeared fluent without language deficits.  Pt reports cog-ling skills to be baseline.  No SLP warranted, please reorder if desire.  Pt educated and agreeable.  Thanks  Pain:   none Intervention Required:   No  Dollene Cleveland, MS [facility] SLP [phone]

## 2011-03-01 NOTE — Progress Notes (Signed)
Inpatient Diabetes Program Recommendations  AACE/ADA: New Consensus Statement on Inpatient Glycemic Control (2009)  Target Ranges:  Prepandial:   less than 140 mg/dL      Peak postprandial:   less than 180 mg/dL (1-2 hours)      Critically ill patients:  140 - 180 mg/dL   Reason for Visit: consult received.  HgBA1C elevated at 9.1 percent (average cbg in low 200's) Fasting particularly high Inpatient Diabetes Program Recommendations Insulin - Basal: Lantus 10 units daily or HS Oral Agents: If not previously tried, Metformin would help with fasting and post-prandial glucose levels for discharge. Start with 500 mg bid Outpatient Referral: Please order if patient appropriate  Note: Will try to see patient before she is discharged.  Lenor Coffin, RN, CNS [phone])

## 2011-07-06 ENCOUNTER — Other Ambulatory Visit: Payer: Self-pay | Admitting: Family Medicine

## 2011-07-06 DIAGNOSIS — Z1231 Encounter for screening mammogram for malignant neoplasm of breast: Secondary | ICD-10-CM

## 2011-07-08 ENCOUNTER — Other Ambulatory Visit: Payer: Self-pay | Admitting: *Deleted

## 2011-07-08 DIAGNOSIS — I10 Essential (primary) hypertension: Secondary | ICD-10-CM

## 2011-07-08 MED ORDER — AMLODIPINE BESY-BENAZEPRIL HCL 5-20 MG PO CAPS: 1.0000 | ORAL_CAPSULE | Freq: Every day | ORAL | Status: DC

## 2011-07-08 MED ORDER — GLIPIZIDE ER 10 MG PO TB24: 10.0000 mg | ORAL_TABLET | Freq: Two times a day (BID) | ORAL | Status: DC

## 2011-07-22 ENCOUNTER — Encounter (HOSPITAL_COMMUNITY): Payer: Self-pay | Admitting: Emergency Medicine

## 2011-07-22 ENCOUNTER — Emergency Department (HOSPITAL_COMMUNITY)
Admission: EM | Admit: 2011-07-22 | Discharge: 2011-07-23 | Disposition: A | Discharge status: Home / Self Care | Payer: Medicare Other | Attending: Emergency Medicine | Admitting: Emergency Medicine

## 2011-07-22 DIAGNOSIS — E7800 Pure hypercholesterolemia, unspecified: Secondary | ICD-10-CM | POA: Insufficient documentation

## 2011-07-22 DIAGNOSIS — T7840XA Allergy, unspecified, initial encounter: Secondary | ICD-10-CM

## 2011-07-22 DIAGNOSIS — I10 Essential (primary) hypertension: Secondary | ICD-10-CM | POA: Insufficient documentation

## 2011-07-22 DIAGNOSIS — Z87891 Personal history of nicotine dependence: Secondary | ICD-10-CM | POA: Insufficient documentation

## 2011-07-22 DIAGNOSIS — L272 Dermatitis due to ingested food: Secondary | ICD-10-CM | POA: Insufficient documentation

## 2011-07-22 DIAGNOSIS — E119 Type 2 diabetes mellitus without complications: Secondary | ICD-10-CM | POA: Insufficient documentation

## 2011-07-22 MED ORDER — PREDNISONE 10 MG PO TABS: 20.0000 mg | ORAL_TABLET | Freq: Every day | ORAL | Status: DC

## 2011-07-22 MED ORDER — METHYLPREDNISOLONE SODIUM SUCC 125 MG IJ SOLR
125.0000 mg | Freq: Once | INTRAMUSCULAR | Status: AC
  Administered 2011-07-22: 125 mg via INTRAVENOUS
  Filled 2011-07-22: qty 2

## 2011-07-22 MED ORDER — FAMOTIDINE IN NACL 20-0.9 MG/50ML-% IV SOLN
20.0000 mg | Freq: Once | INTRAVENOUS | Status: AC
  Administered 2011-07-22: 20 mg via INTRAVENOUS
  Filled 2011-07-22: qty 50

## 2011-07-22 MED ORDER — EPINEPHRINE 0.3 MG/0.3ML IJ DEVI: 0.3000 mg | Freq: Once | INTRAMUSCULAR | Status: DC

## 2011-07-22 MED ORDER — DIPHENHYDRAMINE HCL 50 MG/ML IJ SOLN
50.0000 mg | Freq: Once | INTRAMUSCULAR | Status: AC
  Administered 2011-07-22: 50 mg via INTRAVENOUS
  Filled 2011-07-22: qty 1

## 2011-07-22 NOTE — Discharge Instructions (Signed)
Use Benadryl as directed for the next 2 days. Return at once for any increase in symptoms Allergic Reaction Allergic reactions can be caused by anything your body is sensitive to. Your body may be sensitive to food, medicines, molds, pollens, cockroaches, dust mites, pets, insect stings, and other things around you. An allergic reaction may cause puffiness (swelling), itching, sneezing, coughing, or problems breathing.  Allergies cannot be cured, but they can be controlled with medicine. Some allergies happen only at certain times of the year. Try to stay away from what causes your reaction if possible. Sometimes, it is hard to tell what causes your reaction. HOME CARE If you have a rash or red patches (hives) on your skin:  Take medicines as told by your doctor.   Do not drive or drink alcohol after taking medicines. They can make you sleepy.   Put cold cloths on your skin. Take baths in cool water. This will help your itching. Do not take hot baths or showers. Heat will make the itching worse.   If your allergies get worse, your doctor might give you other medicines. Talk to your doctor if problems continue.  GET HELP RIGHT AWAY IF:   You have trouble breathing.   You have a tight feeling in your chest or throat.   Your mouth gets puffy (swollen).   You have red, itchy patches on your skin (hives) that get worse.   You have itching all over your body.  MAKE SURE YOU:   Understand these instructions.   Will watch your condition.   Will get help right away if you are not doing well or get worse.  Document Released: 01/26/2009 Document Revised: 01/27/2011 Document Reviewed: 01/26/2009 [facility] Patient Information 2012 [place], [place].

## 2011-07-22 NOTE — ED Notes (Signed)
Pt bottom lip has gone down in swelling, even to upper lip in size which she states was her regular. No difficulty breathing.

## 2011-07-22 NOTE — ED Notes (Signed)
Per Pt: previous history of severe allergic reaction that required intubation; pt presents with hives on bilateral upper arms and around mouth. No tongue swelling or airway compromise at this time. Pt reports rapid onset of symptoms. Unknown cause. Hx of HTN, Hyper lipid, DM

## 2011-07-22 NOTE — ED Provider Notes (Signed)
History     CSN: [id number]  Arrival date & time 07/22/11  2056   First MD Initiated Contact with Patient 07/22/11 2126      Chief Complaint  Patient presents with  . Allergic Reaction  . Facial Swelling    (Consider location/radiation/quality/duration/timing/severity/associated sxs/prior treatment) Patient is a 71 y.o. female presenting with allergic reaction. The history is provided by the patient and a relative.  Allergic Reaction   patient here with acute allergic reaction to foods. History of same. Developed hives to her bilateral lower extremities and upper extremities. Some lower lip swelling. No change in her voice or difficulty swallowing. No medications taken for this prior to arrival. Nothing makes her symptoms better or worse  Past Medical History  Diagnosis Date  . Diabetes mellitus   . Hypertension   . High cholesterol     Past Surgical History  Procedure Date  . Bilateral rotator cuff repair     Family History  Problem Relation Age of Onset  . Diabetes Mother   . Cancer Father     History  Substance Use Topics  . Smoking status: Former Smoker    Types: Cigarettes    Quit date: 02/28/1971  . Smokeless tobacco: Not on file  . Alcohol Use: No    OB History    Grav Para Term Preterm Abortions TAB SAB Ect Mult Living                  Review of Systems  All other systems reviewed and are negative.    Allergies  Banana; Corn-containing products; Fish allergy; and Sulfonamide derivatives  Home Medications   Current Outpatient Rx  Name Route Sig Dispense Refill  . AMLODIPINE BESY-BENAZEPRIL HCL 5-20 MG PO CAPS Oral Take 1 capsule by mouth daily. 90 capsule 0  . ASPIRIN 81 MG PO TABS Oral Take 81 mg by mouth daily.      Marland Kitchen GLIPIZIDE ER 10 MG PO TB24 Oral Take 1 tablet (10 mg total) by mouth 2 (two) times daily. 60 tablet 0  . LIVING WELL WITH DIABETES BOOK Does not apply 1 each by Does not apply route once. 1 each 0    BP 163/84  Pulse 109   Temp(Src) 99.5 F (37.5 C) (Oral)  Resp 20  SpO2 99%  Physical Exam  Nursing note and vitals reviewed. Constitutional: She is oriented to person, place, and time. She appears well-developed and well-nourished.  Non-toxic appearance. No distress.  HENT:  Head: Normocephalic and atraumatic.       Slight left lower lip edema noted without tongue swelling. No stridor appreciated  Eyes: Conjunctivae, EOM and lids are normal. Pupils are equal, round, and reactive to light.  Neck: Normal range of motion. Neck supple. No tracheal deviation present. No mass present.  Cardiovascular: Normal rate, regular rhythm and normal heart sounds.  Exam reveals no gallop.   No murmur heard. Pulmonary/Chest: Effort normal and breath sounds normal. No stridor. No respiratory distress. She has no decreased breath sounds. She has no wheezes. She has no rhonchi. She has no rales.  Abdominal: Soft. Normal appearance and bowel sounds are normal. She exhibits no distension. There is no tenderness. There is no rebound and no CVA tenderness.  Musculoskeletal: Normal range of motion. She exhibits no edema and no tenderness.  Neurological: She is alert and oriented to person, place, and time. She has normal strength. No cranial nerve deficit or sensory deficit. GCS eye subscore is 4. GCS verbal subscore  is 5. GCS motor subscore is 6.  Skin: Skin is warm and dry. Rash noted. No abrasion noted. Rash is urticarial.       Has noted to upper and lower extremities.  Psychiatric: She has a normal mood and affect. Her speech is normal and behavior is normal.    ED Course  Procedures (including critical care time)  Labs Reviewed - No data to display No results found.   No diagnosis found.    MDM  Patient given medications for allergic reaction and feels better. Repeat exam is benign. She is due for discharge        Toy Baker, MD 07/22/11 2313

## 2011-07-28 ENCOUNTER — Ambulatory Visit
Admission: RE | Admit: 2011-07-28 | Discharge: 2011-07-28 | Disposition: A | Discharge status: Home / Self Care | Payer: Medicare Other | Source: Ambulatory Visit | Attending: Family Medicine | Admitting: Family Medicine

## 2011-07-28 DIAGNOSIS — Z1231 Encounter for screening mammogram for malignant neoplasm of breast: Secondary | ICD-10-CM

## 2011-09-27 ENCOUNTER — Telehealth: Payer: Self-pay | Admitting: Family Medicine

## 2011-09-27 DIAGNOSIS — I10 Essential (primary) hypertension: Secondary | ICD-10-CM

## 2011-09-27 MED ORDER — AMLODIPINE BESY-BENAZEPRIL HCL 5-20 MG PO CAPS: 1.0000 | ORAL_CAPSULE | Freq: Every day | ORAL | Status: DC

## 2011-09-27 MED ORDER — GLIPIZIDE ER 10 MG PO TB24: 10.0000 mg | ORAL_TABLET | Freq: Two times a day (BID) | ORAL | Status: DC

## 2011-09-27 NOTE — Telephone Encounter (Signed)
Patient called stating that she need a refill of her glipizide and lotril  sent to [facility]. Please assist.

## 2011-10-06 ENCOUNTER — Encounter: Payer: Self-pay | Admitting: Family Medicine

## 2011-10-06 ENCOUNTER — Ambulatory Visit (INDEPENDENT_AMBULATORY_CARE_PROVIDER_SITE_OTHER): Payer: Medicare Other | Admitting: Family Medicine

## 2011-10-06 VITALS — BP 120/76 | Temp 98.7°F | Wt 154.0 lb

## 2011-10-06 DIAGNOSIS — L500 Allergic urticaria: Secondary | ICD-10-CM

## 2011-10-06 DIAGNOSIS — R42 Dizziness and giddiness: Secondary | ICD-10-CM

## 2011-10-06 MED ORDER — EPINEPHRINE 0.3 MG/0.3ML IJ DEVI: 0.3000 mg | Freq: Once | INTRAMUSCULAR | Status: DC

## 2011-10-06 NOTE — Patient Instructions (Addendum)
I would avoid seafood  Consult with Dr. Reather Converse immunologist  Keep the Ann[place] in a cool dry place not the glove compartment of your car  Take Motrin 400 mg twice daily, support shoes and stretching exercises for the plantar fasciitis

## 2011-10-06 NOTE — Progress Notes (Signed)
  Subjective:    Patient ID: Amanda Conway, female    DOB: [birth year], 71 y.o.   MRN: [id number]  HPI Kilie is a 71 year old female who comes in today for evaluation of 2 issues actually 3  She went to the emergency room and was hospitalized for 3 days recently because of vertigo. Diagnostic evaluation was negative. The onset was at morning when she stood up suddenly to get out of bed. It lasted about 2 hours and went away. Hospital workup negative  Get a day she went back emerged from because of allergic reaction. 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 The other day she ate some seafood and the next day he began to have swelling of her lips, itching and hives. No airway edema. She went emerged from treated symptomatically and was discharged.  She's had previous immunologic workup that showed multiple allergies including pine and dust and mold in addition to the food allergies currently asymptomatic  She keeps her EpiPen in the glove compartment of her car!!!!!!!!!  She's also had pain in her right heel for the past year. 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 Reather Converse when necessary  Plantar fasciitis advise Motrin 400 mg twice a day stretching exercises and support shoes

## 2012-01-02 ENCOUNTER — Other Ambulatory Visit: Payer: Self-pay | Admitting: *Deleted

## 2012-01-02 DIAGNOSIS — I10 Essential (primary) hypertension: Secondary | ICD-10-CM

## 2012-01-02 MED ORDER — GLIPIZIDE ER 10 MG PO TB24: 10.0000 mg | ORAL_TABLET | Freq: Two times a day (BID) | ORAL | Status: DC

## 2012-02-10 ENCOUNTER — Encounter: Payer: Self-pay | Admitting: Family Medicine

## 2012-02-21 ENCOUNTER — Encounter: Payer: Medicare Other | Admitting: Family Medicine

## 2012-02-22 ENCOUNTER — Emergency Department (HOSPITAL_COMMUNITY): Payer: Medicare Other

## 2012-02-22 ENCOUNTER — Emergency Department (HOSPITAL_COMMUNITY)
Admission: EM | Admit: 2012-02-22 | Discharge: 2012-02-22 | Disposition: A | Discharge status: Home / Self Care | Payer: Medicare Other | Attending: Emergency Medicine | Admitting: Emergency Medicine

## 2012-02-22 ENCOUNTER — Encounter (HOSPITAL_COMMUNITY): Payer: Self-pay

## 2012-02-22 DIAGNOSIS — S52123A Displaced fracture of head of unspecified radius, initial encounter for closed fracture: Secondary | ICD-10-CM

## 2012-02-22 DIAGNOSIS — Z87891 Personal history of nicotine dependence: Secondary | ICD-10-CM | POA: Insufficient documentation

## 2012-02-22 DIAGNOSIS — Z79899 Other long term (current) drug therapy: Secondary | ICD-10-CM | POA: Insufficient documentation

## 2012-02-22 DIAGNOSIS — Y929 Unspecified place or not applicable: Secondary | ICD-10-CM | POA: Insufficient documentation

## 2012-02-22 DIAGNOSIS — W010XXA Fall on same level from slipping, tripping and stumbling without subsequent striking against object, initial encounter: Secondary | ICD-10-CM | POA: Insufficient documentation

## 2012-02-22 DIAGNOSIS — Y9389 Activity, other specified: Secondary | ICD-10-CM | POA: Insufficient documentation

## 2012-02-22 DIAGNOSIS — I10 Essential (primary) hypertension: Secondary | ICD-10-CM | POA: Insufficient documentation

## 2012-02-22 DIAGNOSIS — E119 Type 2 diabetes mellitus without complications: Secondary | ICD-10-CM | POA: Insufficient documentation

## 2012-02-22 DIAGNOSIS — Z7982 Long term (current) use of aspirin: Secondary | ICD-10-CM | POA: Insufficient documentation

## 2012-02-22 DIAGNOSIS — E7800 Pure hypercholesterolemia, unspecified: Secondary | ICD-10-CM | POA: Insufficient documentation

## 2012-02-22 MED ORDER — HYDROCODONE-ACETAMINOPHEN 5-325 MG PO TABS: 1.0000 | ORAL_TABLET | Freq: Four times a day (QID) | ORAL | Status: DC | PRN

## 2012-02-22 MED ORDER — OXYCODONE-ACETAMINOPHEN 5-325 MG PO TABS
2.0000 | ORAL_TABLET | Freq: Once | ORAL | Status: AC
  Administered 2012-02-22: 1 via ORAL
  Filled 2012-02-22: qty 2

## 2012-02-22 NOTE — ED Notes (Addendum)
Patient reports that she tripped on some shoes this AM at 0230 and fell, landing on her right side. Patient c/o right arm, leg and right great toe pain.

## 2012-02-22 NOTE — ED Provider Notes (Signed)
Medical screening examination/treatment/procedure(s) were conducted as a shared visit with non-physician practitioner(s) and myself.  I personally evaluated the patient during the encounter  Patient with mechanical fall tripping on her shoes. X-rays are pending and she will likely be discharged  Toy Baker, MD 02/22/12 1546

## 2012-02-22 NOTE — ED Provider Notes (Signed)
History   This chart was scribed for non-physician practitioner working with Toy Baker, MD by Charolett Bumpers, ED Scribe. This patient was seen in room WTR8/WTR8 and the patient's care was started at 1339.   CSN: [id number]  Arrival date & time 02/22/12  1311   First MD Initiated Contact with Patient 02/22/12 1339      Chief Complaint  Patient presents with  . Fall  . right sided pain     The history is provided by the patient. No language interpreter was used.  Amanda Conway is a 72 y.o. female who presents to the Emergency Department complaining of constant, severe right leg and arm pain after falling. She states the pain is located in her right foot, knee, hip, shoulder, wrist and elbow. She states that she fell after tripping over some shoes this morning around 0230 am. She reports associated swelling. She reports her pain is aggravated with movement and relieved with staying immobile. She rates her pain 9/10 with movement. She denies any syncope or dizziness prior to the fall. She reports she needed assistance getting up due to the pain. She denies any LOC or hitting her head. She denies any numbness or tingling in her extremities. She takes an 81 mg aspirin daily. She reports a h/o injury in her right knee.   Past Medical History  Diagnosis Date  . Diabetes mellitus   . Hypertension   . High cholesterol     Past Surgical History  Procedure Date  . Bilateral rotator cuff repair     Family History  Problem Relation Age of Onset  . Diabetes Mother   . Cancer Father     History  Substance Use Topics  . Smoking status: Former Smoker    Types: Cigarettes    Quit date: 02/28/1971  . Smokeless tobacco: Not on file  . Alcohol Use: No    OB History    Grav Para Term Preterm Abortions TAB SAB Ect Mult Living                  Review of Systems  Musculoskeletal: Positive for arthralgias.  Neurological: Negative for numbness.  All other systems reviewed and  are negative.    Allergies  Banana; Corn-containing products; Fish allergy; and Sulfonamide derivatives  Home Medications   Current Outpatient Rx  Name  Route  Sig  Dispense  Refill  . AMLODIPINE BESY-BENAZEPRIL HCL 5-20 MG PO CAPS   Oral   Take 1 capsule by mouth daily.   90 capsule   0   . ASPIRIN 81 MG PO TABS   Oral   Take 81 mg by mouth daily.           Marland Kitchen GLIPIZIDE ER 10 MG PO TB24   Oral   Take 1 tablet (10 mg total) by mouth 2 (two) times daily.   180 tablet   0   . EPINEPHRINE 0.3 MG/0.3ML IJ DEVI   Intramuscular   Inject 0.3 mLs (0.3 mg total) into the muscle once.   1 Device   2     BP 175/69  Pulse 84  Temp 98.4 F (36.9 C) (Oral)  Resp 18  SpO2 98%  Physical Exam  Nursing note and vitals reviewed. Constitutional: She is oriented to person, place, and time. She appears well-developed and well-nourished. No distress.  HENT:  Head: Normocephalic and atraumatic.  Eyes: EOM are normal.  Neck: Neck supple. No tracheal deviation present.  Cardiovascular: Normal  rate.        Pulses intact and brisk cap refill in upper and lower extremities.    Pulmonary/Chest: Effort normal. No respiratory distress.  Musculoskeletal: She exhibits tenderness.       Right great toe MTP ttp. Anterior foot ttp. No pain in ankle. Mild pain in right knee, more tender over the lateral aspect of the knee. Ttp over greater trochanter of right hip. Mild swelling and ttp over the hypothenar eminence on the right. Mildly ttp over the right elbow and shoulder. Right arm ROM 4/5, strength deferred. Right leg ROM deferred.   Neurological: She is alert and oriented to person, place, and time.  Skin: Skin is warm and dry.  Psychiatric: She has a normal mood and affect. Her behavior is normal.    ED Course  Procedures (including critical care time)  DIAGNOSTIC STUDIES: Oxygen Saturation is 98% on room air, normal by my interpretation.    COORDINATION OF CARE:  14:06-Discussed  planned course of treatment with the patient including pain medication and multiple x-rays, who is agreeable at this time.   14:15-Medication Orders: Oxycodone-acetaminophen (Percocet/Roxicet) 5-325 mg per tablet 2 tablet-once.   Labs Reviewed - No data to display No results found.   No diagnosis found.    MDM  3:34 PM Patient signed out to Ebbie Ridge, PA-C, who will continue care at this time. Plan at signout is to discharge to home pending plain films. Pain control with Vicodin, and primary care followup.   I personally performed the services described in this documentation, which was scribed in my presence. The recorded information has been reviewed and is accurate.        Roxy Horseman, PA-C 02/22/12 1535

## 2012-02-25 NOTE — ED Provider Notes (Signed)
Medical screening examination/treatment/procedure(s) were performed by non-physician practitioner and as supervising physician I was immediately available for consultation/collaboration.  Seanna Sisler T Greg Cratty, MD 02/25/12 1644 

## 2012-03-01 ENCOUNTER — Ambulatory Visit (INDEPENDENT_AMBULATORY_CARE_PROVIDER_SITE_OTHER): Payer: Medicare Other | Admitting: Family Medicine

## 2012-03-01 ENCOUNTER — Other Ambulatory Visit: Payer: Self-pay | Admitting: Orthopedic Surgery

## 2012-03-01 ENCOUNTER — Encounter: Payer: Self-pay | Admitting: Family Medicine

## 2012-03-01 ENCOUNTER — Telehealth: Payer: Self-pay | Admitting: Family Medicine

## 2012-03-01 VITALS — BP 140/90 | Temp 98.1°F | Ht 60.25 in | Wt 154.0 lb

## 2012-03-01 DIAGNOSIS — S52123A Displaced fracture of head of unspecified radius, initial encounter for closed fracture: Secondary | ICD-10-CM

## 2012-03-01 DIAGNOSIS — D235 Other benign neoplasm of skin of trunk: Secondary | ICD-10-CM

## 2012-03-01 DIAGNOSIS — I10 Essential (primary) hypertension: Secondary | ICD-10-CM

## 2012-03-01 DIAGNOSIS — Z23 Encounter for immunization: Secondary | ICD-10-CM

## 2012-03-01 DIAGNOSIS — S42401A Unspecified fracture of lower end of right humerus, initial encounter for closed fracture: Secondary | ICD-10-CM

## 2012-03-01 DIAGNOSIS — E785 Hyperlipidemia, unspecified: Secondary | ICD-10-CM

## 2012-03-01 DIAGNOSIS — Z0000 Encounter for general adult medical examination without abnormal findings: Secondary | ICD-10-CM

## 2012-03-01 DIAGNOSIS — S42409A Unspecified fracture of lower end of unspecified humerus, initial encounter for closed fracture: Secondary | ICD-10-CM

## 2012-03-01 DIAGNOSIS — E119 Type 2 diabetes mellitus without complications: Secondary | ICD-10-CM

## 2012-03-01 LAB — BASIC METABOLIC PANEL
BUN: 12 mg/dL (ref 6–23)
CO2: 25 mEq/L (ref 19–32)
Chloride: 98 mEq/L (ref 96–112)
Creatinine, Ser: 0.7 mg/dL (ref 0.4–1.2)
Glucose, Bld: 159 mg/dL — ABNORMAL HIGH (ref 70–99)
Potassium: 3.6 mEq/L (ref 3.5–5.1)

## 2012-03-01 LAB — CBC WITH DIFFERENTIAL/PLATELET
Basophils Absolute: 0 10*3/uL (ref 0.0–0.1)
Eosinophils Relative: 3 % (ref 0.0–5.0)
Monocytes Absolute: 0.4 10*3/uL (ref 0.1–1.0)
Monocytes Relative: 6.4 % (ref 3.0–12.0)
Neutrophils Relative %: 64.5 % (ref 43.0–77.0)
Platelets: 402 10*3/uL — ABNORMAL HIGH (ref 150.0–400.0)
RDW: 13.8 % (ref 11.5–14.6)
WBC: 6.4 10*3/uL (ref 4.5–10.5)

## 2012-03-01 LAB — MICROALBUMIN / CREATININE URINE RATIO: Creatinine,U: 71.6 mg/dL

## 2012-03-01 LAB — POCT URINALYSIS DIPSTICK
Ketones, UA: NEGATIVE
Nitrite, UA: NEGATIVE
Protein, UA: NEGATIVE
pH, UA: 5.5

## 2012-03-01 LAB — LIPID PANEL
Cholesterol: 251 mg/dL — ABNORMAL HIGH (ref 0–200)
Triglycerides: 135 mg/dL (ref 0.0–149.0)

## 2012-03-01 LAB — LDL CHOLESTEROL, DIRECT: Direct LDL: 169.9 mg/dL

## 2012-03-01 LAB — HEPATIC FUNCTION PANEL
ALT: 11 U/L (ref 0–35)
AST: 16 U/L (ref 0–37)
Albumin: 3.9 g/dL (ref 3.5–5.2)
Alkaline Phosphatase: 115 U/L (ref 39–117)

## 2012-03-01 LAB — HEMOGLOBIN A1C: Hgb A1c MFr Bld: 8.5 % — ABNORMAL HIGH (ref 4.6–6.5)

## 2012-03-01 MED ORDER — GLIPIZIDE ER 10 MG PO TB24: 10.0000 mg | ORAL_TABLET | Freq: Two times a day (BID) | ORAL | Status: DC

## 2012-03-01 MED ORDER — AMLODIPINE BESY-BENAZEPRIL HCL 5-20 MG PO CAPS: 1.0000 | ORAL_CAPSULE | Freq: Every day | ORAL | Status: DC

## 2012-03-01 NOTE — Telephone Encounter (Signed)
Spoke with patient.

## 2012-03-01 NOTE — Patient Instructions (Signed)
Continue current medications  We will call you in a couple days with a report on your lab work  We will call you within 2 weeks with the report on the lesion we removed for your back. If we do not call you within 2 weeks call us

## 2012-03-01 NOTE — Telephone Encounter (Signed)
Caller: Amanda Conway/Patient; Phone: [phone]; Reason for Call: Patient has an appt today 03/01/12 at 12: 15 .  (1) FYI- She has a fractured in Right elbow- She is being treated by orthopedist.  (2) She is wanting to know if she is having blood work today at 12: 15- What is she having done at the appt today?  Reviewed appt schedule for today . It shows CPE-30 min , Dr. Tawanna Cooler at a note from [facility] that patient will "come in fasting".  Patient states she just ate because she needed to tak her medication.   PLEASE CONTACT PATIENT IN REGARDS TO APPT/ FASTING.  [phone]

## 2012-03-01 NOTE — Progress Notes (Signed)
  Subjective:    Patient ID: Amanda Conway, female    DOB: 08/31/1940, 72 y.o.   MRN: [id number]  HPI Amanda Conway is a 72 year old single female nonsmoker who comes in today for a Medicare wellness examination  She currently takes Lotrel 5-20 dose one daily for hypertension BP within normal range  She also takes glipizide 10 mg twice a day for diabetes she does not recall what her last blood sugar Y. as she hasn't checked it months.  January when she fell and fractured the right radial head. She has a splint on and is due to go back to see the orthopedist for followup  She gets routine eye care, dental care, BSE monthly, and you mammography, colonoscopy and GI, tetanus booster at 2011 seasonal flu shot today information given on shingles  Cognitive function normal she walks on a regular basis home health safety reviewed no issues identified, no guns in the house, she does have a health care power of attorney and living well   Review of Systems  Constitutional: Negative.   HENT: Negative.   Eyes: Negative.   Respiratory: Negative.   Cardiovascular: Negative.   Gastrointestinal: Negative.   Genitourinary: Negative.   Musculoskeletal: Negative.   Neurological: Negative.   Hematological: Negative.   Psychiatric/Behavioral: Negative.        Objective:   Physical Exam  Constitutional: She appears well-developed and well-nourished.  HENT:  Head: Normocephalic and atraumatic.  Right Ear: External ear normal.  Left Ear: External ear normal.  Nose: Nose normal.  Mouth/Throat: Oropharynx is clear and moist.  Eyes: EOM are normal. Pupils are equal, round, and reactive to light.  Neck: Normal range of motion. Neck supple. No thyromegaly present.  Cardiovascular: Normal rate, regular rhythm, normal heart sounds and intact distal pulses.  Exam reveals no gallop and no friction rub.   No murmur heard. Pulmonary/Chest: Effort normal and breath sounds normal.  Abdominal: Soft. Bowel sounds  are normal. She exhibits no distension and no mass. There is no tenderness. There is no rebound.  Genitourinary:       Bilateral breast exam normal  Musculoskeletal: Normal range of motion.       Splint right arm  Lymphadenopathy:    She has no cervical adenopathy.  Neurological: She is alert. She has normal reflexes. No cranial nerve deficit. She exhibits normal muscle tone. Coordination normal.  Skin: Skin is warm and dry.       Total body skin exam normal except for a black lesion on her right posterior shoulder which will be removed today question melanoma  Psychiatric: She has a normal mood and affect. Her behavior is normal. Judgment and thought content normal.          Assessment & Plan:  Healthy female  Hypertension continue current medication  Diabetes type 2 continue glipizide 10 mg twice a day check labs  Fractured right elbow followup by orthopedist  Abnormal lesion on her back.... this is a 10 mm x10 mm black lesion right upper back.......... after informed consent the lesion was cleaned with alcohol and 1% Xylocaine with epinephrine was used for anesthesia. The lesion was excised with 3 mm margins and sent for pathologic analysis. The bases cauterized Band-Aids applied,,,,,,,,, path pending...Marland KitchenMarland Kitchen

## 2012-03-06 ENCOUNTER — Telehealth: Payer: Self-pay | Admitting: Family Medicine

## 2012-03-06 NOTE — Telephone Encounter (Signed)
Caller: Shaylea/Patient; Phone: [phone]; Reason for Call: Returning call from Arnett from 03/05/12. Please call back.

## 2012-03-06 NOTE — Telephone Encounter (Signed)
Left message on machine returning patient's call 

## 2012-03-07 NOTE — Telephone Encounter (Signed)
Spoke with patient and she is aware of lab and path report

## 2012-03-08 ENCOUNTER — Ambulatory Visit
Admission: RE | Admit: 2012-03-08 | Discharge: 2012-03-08 | Disposition: A | Discharge status: Home / Self Care | Payer: Medicare Other | Source: Ambulatory Visit | Attending: Orthopedic Surgery | Admitting: Orthopedic Surgery

## 2012-03-08 DIAGNOSIS — S52123A Displaced fracture of head of unspecified radius, initial encounter for closed fracture: Secondary | ICD-10-CM

## 2012-08-01 ENCOUNTER — Other Ambulatory Visit: Payer: Self-pay

## 2012-08-01 DIAGNOSIS — Z1231 Encounter for screening mammogram for malignant neoplasm of breast: Secondary | ICD-10-CM

## 2012-08-30 ENCOUNTER — Ambulatory Visit: Payer: Medicare Other

## 2012-08-30 ENCOUNTER — Ambulatory Visit
Admission: RE | Admit: 2012-08-30 | Discharge: 2012-08-30 | Disposition: A | Discharge status: Home / Self Care | Payer: Medicare Other | Source: Ambulatory Visit

## 2012-08-30 DIAGNOSIS — Z1231 Encounter for screening mammogram for malignant neoplasm of breast: Secondary | ICD-10-CM

## 2013-02-19 LAB — HM DIABETES EYE EXAM

## 2013-02-20 ENCOUNTER — Encounter: Payer: Self-pay | Admitting: Family Medicine

## 2013-05-31 ENCOUNTER — Telehealth: Payer: Self-pay | Admitting: Family Medicine

## 2013-05-31 DIAGNOSIS — I10 Essential (primary) hypertension: Secondary | ICD-10-CM

## 2013-05-31 DIAGNOSIS — E785 Hyperlipidemia, unspecified: Secondary | ICD-10-CM

## 2013-05-31 DIAGNOSIS — E119 Type 2 diabetes mellitus without complications: Secondary | ICD-10-CM

## 2013-05-31 DIAGNOSIS — Z23 Encounter for immunization: Secondary | ICD-10-CM

## 2013-05-31 DIAGNOSIS — S42401A Unspecified fracture of lower end of right humerus, initial encounter for closed fracture: Secondary | ICD-10-CM

## 2013-05-31 DIAGNOSIS — D235 Other benign neoplasm of skin of trunk: Secondary | ICD-10-CM

## 2013-05-31 MED ORDER — GLIPIZIDE ER 10 MG PO TB24: 10.0000 mg | ORAL_TABLET | Freq: Two times a day (BID) | ORAL | Status: DC

## 2013-05-31 MED ORDER — AMLODIPINE BESY-BENAZEPRIL HCL 5-20 MG PO CAPS: 1.0000 | ORAL_CAPSULE | Freq: Every day | ORAL | Status: DC

## 2013-05-31 NOTE — Telephone Encounter (Signed)
Pt would like Apolonio Schneiders to call her. Would not give me any other information said it was personal  Phone number;  [phone]

## 2013-05-31 NOTE — Telephone Encounter (Signed)
Left message on machine returning patient's call 

## 2013-08-27 ENCOUNTER — Encounter: Payer: Medicare Other | Admitting: Family Medicine

## 2013-10-02 ENCOUNTER — Encounter: Payer: Self-pay | Admitting: Family Medicine

## 2013-10-02 ENCOUNTER — Ambulatory Visit (INDEPENDENT_AMBULATORY_CARE_PROVIDER_SITE_OTHER): Payer: Medicare Other | Admitting: Family Medicine

## 2013-10-02 ENCOUNTER — Telehealth: Payer: Self-pay | Admitting: Family Medicine

## 2013-10-02 ENCOUNTER — Other Ambulatory Visit (HOSPITAL_COMMUNITY)
Admission: RE | Admit: 2013-10-02 | Discharge: 2013-10-02 | Disposition: A | Discharge status: Home / Self Care | Payer: Medicare Other | Source: Ambulatory Visit | Attending: Family Medicine | Admitting: Family Medicine

## 2013-10-02 VITALS — BP 120/84 | Temp 99.1°F | Ht 60.0 in | Wt 156.0 lb

## 2013-10-02 DIAGNOSIS — I10 Essential (primary) hypertension: Secondary | ICD-10-CM

## 2013-10-02 DIAGNOSIS — Z1151 Encounter for screening for human papillomavirus (HPV): Secondary | ICD-10-CM | POA: Diagnosis present

## 2013-10-02 DIAGNOSIS — E785 Hyperlipidemia, unspecified: Secondary | ICD-10-CM

## 2013-10-02 DIAGNOSIS — Z01419 Encounter for gynecological examination (general) (routine) without abnormal findings: Secondary | ICD-10-CM | POA: Diagnosis present

## 2013-10-02 DIAGNOSIS — IMO0002 Reserved for concepts with insufficient information to code with codable children: Secondary | ICD-10-CM

## 2013-10-02 DIAGNOSIS — IMO0001 Reserved for inherently not codable concepts without codable children: Secondary | ICD-10-CM

## 2013-10-02 DIAGNOSIS — Z0000 Encounter for general adult medical examination without abnormal findings: Secondary | ICD-10-CM

## 2013-10-02 DIAGNOSIS — E1165 Type 2 diabetes mellitus with hyperglycemia: Secondary | ICD-10-CM

## 2013-10-02 DIAGNOSIS — E119 Type 2 diabetes mellitus without complications: Secondary | ICD-10-CM

## 2013-10-02 DIAGNOSIS — E876 Hypokalemia: Secondary | ICD-10-CM

## 2013-10-02 DIAGNOSIS — Z23 Encounter for immunization: Secondary | ICD-10-CM

## 2013-10-02 DIAGNOSIS — D235 Other benign neoplasm of skin of trunk: Secondary | ICD-10-CM

## 2013-10-02 LAB — POCT URINALYSIS DIPSTICK
BILIRUBIN UA: NEGATIVE
Blood, UA: NEGATIVE
GLUCOSE UA: NEGATIVE
KETONES UA: NEGATIVE
NITRITE UA: NEGATIVE
Protein, UA: NEGATIVE
Spec Grav, UA: <=1.005
Urobilinogen, UA: 0.2
pH, UA: 5.5

## 2013-10-02 LAB — CBC WITH DIFFERENTIAL/PLATELET
BASOS ABS: 0 10*3/uL (ref 0.0–0.1)
Basophils Relative: 0.3 % (ref 0.0–3.0)
EOS PCT: 3.2 % (ref 0.0–5.0)
Eosinophils Absolute: 0.2 10*3/uL (ref 0.0–0.7)
HEMATOCRIT: 38.4 % (ref 36.0–46.0)
HEMOGLOBIN: 13 g/dL (ref 12.0–15.0)
LYMPHS ABS: 1.9 10*3/uL (ref 0.7–4.0)
LYMPHS PCT: 30.2 % (ref 12.0–46.0)
MCHC: 33.9 g/dL (ref 30.0–36.0)
MCV: 94.1 fl (ref 78.0–100.0)
MONOS PCT: 7.6 % (ref 3.0–12.0)
Monocytes Absolute: 0.5 10*3/uL (ref 0.1–1.0)
NEUTROS ABS: 3.7 10*3/uL (ref 1.4–7.7)
Neutrophils Relative %: 58.7 % (ref 43.0–77.0)
Platelets: 355 10*3/uL (ref 150.0–400.0)
RBC: 4.08 Mil/uL (ref 3.87–5.11)
RDW: 14.7 % (ref 11.5–15.5)
WBC: 6.3 10*3/uL (ref 4.0–10.5)

## 2013-10-02 LAB — LIPID PANEL
CHOL/HDL RATIO: 5
Cholesterol: 285 mg/dL — ABNORMAL HIGH (ref 0–200)
HDL: 52.5 mg/dL (ref >39.00)
LDL CALC: 215 mg/dL — AB (ref 0–99)
NONHDL: 232.5
Triglycerides: 89 mg/dL (ref 0.0–149.0)
VLDL: 17.8 mg/dL (ref 0.0–40.0)

## 2013-10-02 LAB — BASIC METABOLIC PANEL
BUN: 17 mg/dL (ref 6–23)
CHLORIDE: 101 meq/L (ref 96–112)
CO2: 28 meq/L (ref 19–32)
Calcium: 9.4 mg/dL (ref 8.4–10.5)
Creatinine, Ser: 0.7 mg/dL (ref 0.4–1.2)
GFR: 105.55 mL/min (ref >60.00)
Glucose, Bld: 137 mg/dL — ABNORMAL HIGH (ref 70–99)
Potassium: 3.6 mEq/L (ref 3.5–5.1)
SODIUM: 138 meq/L (ref 135–145)

## 2013-10-02 LAB — TSH: TSH: 3.17 u[IU]/mL (ref 0.35–4.50)

## 2013-10-02 LAB — HEPATIC FUNCTION PANEL
ALBUMIN: 3.9 g/dL (ref 3.5–5.2)
ALK PHOS: 108 U/L (ref 39–117)
ALT: 11 U/L (ref 0–35)
AST: 14 U/L (ref 0–37)
Bilirubin, Direct: 0.1 mg/dL (ref 0.0–0.3)
TOTAL PROTEIN: 7 g/dL (ref 6.0–8.3)
Total Bilirubin: 0.8 mg/dL (ref 0.2–1.2)

## 2013-10-02 LAB — MICROALBUMIN / CREATININE URINE RATIO
Creatinine,U: 39.8 mg/dL
Microalb Creat Ratio: 0.3 mg/g (ref 0.0–30.0)
Microalb, Ur: 0.1 mg/dL (ref 0.0–1.9)

## 2013-10-02 LAB — HEMOGLOBIN A1C: Hgb A1c MFr Bld: 9.2 % — ABNORMAL HIGH (ref 4.6–6.5)

## 2013-10-02 MED ORDER — GLIPIZIDE ER 10 MG PO TB24: 10.0000 mg | ORAL_TABLET | Freq: Two times a day (BID) | ORAL | Status: DC

## 2013-10-02 MED ORDER — AMLODIPINE BESY-BENAZEPRIL HCL 5-20 MG PO CAPS: 1.0000 | ORAL_CAPSULE | Freq: Every day | ORAL | Status: DC

## 2013-10-02 NOTE — Progress Notes (Signed)
Pre visit review using our clinic review tool, if applicable. No additional management support is needed unless otherwise documented below in the visit note. Lab Results  Component Value Date   HGBA1C 8.5* 03/01/2012   HGBA1C 9.1* 03/01/2011   HGBA1C 8.4* 12/28/2009   Lab Results  Component Value Date   MICROALBUR 0.9 03/01/2012   LDLCALC 163* 03/01/2011   CREATININE 0.7 03/01/2012

## 2013-10-02 NOTE — Progress Notes (Signed)
   Subjective:    Patient ID: Amanda Conway, female    DOB: [birth year], 73 y.o.   MRN: [id number]  HPI  Amanda Conway is a 73 year old female who comes in today for followup of hypertension, diabetes  She has been noncompliant her last hemoglobin A1c was 15 months ago was over 8.0%.  She takes carbazide 10 mg twice a day she tries to walk daily. She does not check her blood sugar at home  Weight steady at 156  She takes Lotrel 5-20 daily for hypertension BP 120/84  She gets routine eye care, dental care, BSE monthly, and you mammography, colonoscopy and GI  Vaccinations up-to-date  Review of Systems  Constitutional: Negative.   HENT: Negative.   Eyes: Negative.   Respiratory: Negative.   Cardiovascular: Negative.   Gastrointestinal: Negative.   Genitourinary: Negative.   Musculoskeletal: Negative.   Neurological: Negative.   Psychiatric/Behavioral: Negative.        Objective:   Physical Exam  Constitutional: She appears well-developed and well-nourished.  HENT:  Head: Normocephalic and atraumatic.  Right Ear: External ear normal.  Left Ear: External ear normal.  Nose: Nose normal.  Mouth/Throat: Oropharynx is clear and moist.  Eyes: EOM are normal. Pupils are equal, round, and reactive to light.  Neck: Normal range of motion. Neck supple. No thyromegaly present.  Cardiovascular: Normal rate, regular rhythm, normal heart sounds and intact distal pulses.  Exam reveals no gallop and no friction rub.   No murmur heard. Pulmonary/Chest: Effort normal and breath sounds normal.  Abdominal: Soft. Bowel sounds are normal. She exhibits no distension and no mass. There is no tenderness. There is no rebound.  Genitourinary: Vagina normal and uterus normal. Guaiac negative stool. No vaginal discharge found.  Musculoskeletal: Normal range of motion.  Lymphadenopathy:    She has no cervical adenopathy.  Neurological: She is alert. She has normal reflexes. No cranial nerve deficit.  She exhibits normal muscle tone. Coordination normal.  Skin: Skin is warm and dry.  Psychiatric: She has a normal mood and affect. Her behavior is normal. Judgment and thought content normal.   Bilateral breast exam normal pelvic examination external genitalia within normal limits vaginal vault was normal cervix was visualized Pap smear was done bimanual exam negative rectum normal stool guaiac-negative       Assessment & Plan:  Healthy female  Diabetes type 2 not at goal,,,,,,,,, again stressed diet exercise medication check labs today followup in 2 weeks  Hypertension at goal continue current therapy,,

## 2013-10-02 NOTE — Telephone Encounter (Signed)
Relevant patient education mailed to patient.  

## 2013-10-02 NOTE — Patient Instructions (Signed)
Continue current medications  Check a fasting blood sugar daily in the morning  Return in 2 weeks for followup  Strict no sugar diet  Walk 30 minutes daily

## 2013-10-03 ENCOUNTER — Other Ambulatory Visit: Payer: Self-pay | Admitting: Family Medicine

## 2013-10-03 DIAGNOSIS — E119 Type 2 diabetes mellitus without complications: Secondary | ICD-10-CM

## 2013-10-03 LAB — CYTOLOGY - PAP

## 2013-10-07 ENCOUNTER — Other Ambulatory Visit: Payer: Self-pay

## 2013-10-07 DIAGNOSIS — Z1231 Encounter for screening mammogram for malignant neoplasm of breast: Secondary | ICD-10-CM

## 2013-10-14 ENCOUNTER — Ambulatory Visit
Admission: RE | Admit: 2013-10-14 | Discharge: 2013-10-14 | Disposition: A | Discharge status: Home / Self Care | Payer: Medicare Other | Source: Ambulatory Visit

## 2013-10-14 DIAGNOSIS — Z1231 Encounter for screening mammogram for malignant neoplasm of breast: Secondary | ICD-10-CM

## 2013-10-15 ENCOUNTER — Telehealth: Payer: Self-pay | Admitting: Family Medicine

## 2013-10-15 MED ORDER — ACCU-CHEK FASTCLIX LANCETS MISC: 1.0000 | Freq: Every day | Status: DC

## 2013-10-15 MED ORDER — GLUCOSE BLOOD VI STRP
1.0000 | ORAL_STRIP | Freq: Every day | Status: DC
Start: 2013-10-15 — End: 2014-02-13

## 2013-10-15 NOTE — Telephone Encounter (Signed)
Pt requesting call back from [facility] regarding glucometer.  Pt states pharmacy said the need a script for glucometer, lancets and test strips..  They also need to know how often she tests.  Pt states she tests 1 time per day and that her insurance will cover either of the following meters.  One Touch Ultra Mini Accuchek Nano Freestyle Nano  Pharmacy:  [facility]

## 2013-10-15 NOTE — Telephone Encounter (Signed)
Spoke with patient and Rx sent and glucometer available to pick up.

## 2013-10-17 ENCOUNTER — Ambulatory Visit: Payer: Medicare Other | Admitting: Family Medicine

## 2013-10-23 ENCOUNTER — Other Ambulatory Visit: Payer: Self-pay | Admitting: Family Medicine

## 2013-10-29 ENCOUNTER — Ambulatory Visit (INDEPENDENT_AMBULATORY_CARE_PROVIDER_SITE_OTHER): Payer: Medicare Other | Admitting: Family Medicine

## 2013-10-29 ENCOUNTER — Encounter: Payer: Self-pay | Admitting: Family Medicine

## 2013-10-29 VITALS — BP 140/70 | Temp 98.1°F | Wt 151.0 lb

## 2013-10-29 DIAGNOSIS — E785 Hyperlipidemia, unspecified: Secondary | ICD-10-CM

## 2013-10-29 DIAGNOSIS — E119 Type 2 diabetes mellitus without complications: Secondary | ICD-10-CM

## 2013-10-29 DIAGNOSIS — I10 Essential (primary) hypertension: Secondary | ICD-10-CM

## 2013-10-29 MED ORDER — PRAVASTATIN SODIUM 10 MG PO TABS: ORAL_TABLET | ORAL | Status: DC

## 2013-10-29 NOTE — Patient Instructions (Signed)
Continue diet and exercise  Continue glipizide 10 mg twice daily  Fasting blood sugar daily in the morning  Followup in 2 months  Labs nonfasting one week prior  Pravachol 10 mg........Marland Kitchen 1 Monday Wednesday Friday at bedtime

## 2013-10-29 NOTE — Progress Notes (Signed)
   Subjective:    Patient ID: Amanda Conway, female    DOB: June 09, 1940, 73 y.o.   MRN: [id number]  HPI Amanda Conway is a 73 year old female who comes in today for reevaluation diabetes type 2  She's on a max dose of glipizide 10 mg twice a day. In the past we've tried metformin but she cannot tolerated she has GI side effects even low dose.  Her most recent A1c 3 weeks ago 9.2%. We had a long discussion about diet exercise weight loss.......... she states she's lost 5 pounds in the last 3 weeks...... and the fact that her pancreas is no longer making enough insulin. I recommend she start an insulin supplement daily however she declines. She was to continue the oral medication diet exercise and weight loss. She also has an appointment to see a dietitian October 1. I do not think at this juncture that she can get away without taking insulin however she declines  She also has marked elevation of her LDL 215 with a total of 285 and HDL 53. In the past we've tried her on statins but the cause leg pain and she can't sleep.  Blood pressure today 140/70.   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 my recommendation at this juncture would be to start insulin daily.......... patient declines............ continue diet exercise and glipizide........ followup in 6 weeks  Hypertension at goal continue current therapy  Elevated LDL.......... trial of low-dose Pravachol Monday Wednesday Friday

## 2013-10-29 NOTE — Progress Notes (Signed)
Pre visit review using our clinic review tool, if applicable. No additional management support is needed unless otherwise documented below in the visit note. 

## 2013-11-21 ENCOUNTER — Ambulatory Visit: Payer: Medicare Other | Admitting: *Deleted

## 2013-12-24 ENCOUNTER — Other Ambulatory Visit (INDEPENDENT_AMBULATORY_CARE_PROVIDER_SITE_OTHER): Payer: Medicare Other

## 2013-12-24 DIAGNOSIS — E785 Hyperlipidemia, unspecified: Secondary | ICD-10-CM

## 2013-12-24 DIAGNOSIS — E119 Type 2 diabetes mellitus without complications: Secondary | ICD-10-CM

## 2013-12-24 LAB — LIPID PANEL
CHOL/HDL RATIO: 4
Cholesterol: 253 mg/dL — ABNORMAL HIGH (ref 0–200)
HDL: 58.2 mg/dL (ref >39.00)
LDL Cholesterol: 176 mg/dL — ABNORMAL HIGH (ref 0–99)
NonHDL: 194.8
Triglycerides: 92 mg/dL (ref 0.0–149.0)
VLDL: 18.4 mg/dL (ref 0.0–40.0)

## 2013-12-24 LAB — HEPATIC FUNCTION PANEL
ALBUMIN: 3.4 g/dL — AB (ref 3.5–5.2)
ALT: 13 U/L (ref 0–35)
AST: 14 U/L (ref 0–37)
Alkaline Phosphatase: 105 U/L (ref 39–117)
Bilirubin, Direct: 0.1 mg/dL (ref 0.0–0.3)
Total Bilirubin: 0.7 mg/dL (ref 0.2–1.2)
Total Protein: 7.1 g/dL (ref 6.0–8.3)

## 2013-12-24 LAB — BASIC METABOLIC PANEL
BUN: 17 mg/dL (ref 6–23)
CO2: 31 mEq/L (ref 19–32)
Calcium: 9.5 mg/dL (ref 8.4–10.5)
Chloride: 102 mEq/L (ref 96–112)
Creatinine, Ser: 0.7 mg/dL (ref 0.4–1.2)
GFR: 98.93 mL/min (ref >60.00)
Glucose, Bld: 178 mg/dL — ABNORMAL HIGH (ref 70–99)
POTASSIUM: 4.4 meq/L (ref 3.5–5.1)
Sodium: 139 mEq/L (ref 135–145)

## 2013-12-24 LAB — MICROALBUMIN / CREATININE URINE RATIO
Creatinine,U: 71.9 mg/dL
Microalb Creat Ratio: 0.4 mg/g (ref 0.0–30.0)
Microalb, Ur: 0.3 mg/dL (ref 0.0–1.9)

## 2013-12-24 LAB — HEMOGLOBIN A1C: Hgb A1c MFr Bld: 8.5 % — ABNORMAL HIGH (ref 4.6–6.5)

## 2013-12-31 ENCOUNTER — Ambulatory Visit (INDEPENDENT_AMBULATORY_CARE_PROVIDER_SITE_OTHER): Payer: Medicare Other | Admitting: Family Medicine

## 2013-12-31 ENCOUNTER — Encounter: Payer: Self-pay | Admitting: Family Medicine

## 2013-12-31 VITALS — BP 120/80 | Temp 98.5°F | Wt 149.0 lb

## 2013-12-31 DIAGNOSIS — E139 Other specified diabetes mellitus without complications: Secondary | ICD-10-CM

## 2013-12-31 NOTE — Progress Notes (Signed)
Pre visit review using our clinic review tool, if applicable. No additional management support is needed unless otherwise documented below in the visit note. Lab Results  Component Value Date   HGBA1C 8.5* 12/24/2013   HGBA1C 9.2* 10/02/2013   HGBA1C 8.5* 03/01/2012   Lab Results  Component Value Date   MICROALBUR 0.3 12/24/2013   LDLCALC 176* 12/24/2013   CREATININE 0.7 12/24/2013

## 2013-12-31 NOTE — Patient Instructions (Addendum)
Begin 5 units of insulin nightly at bedtime starting tomorrow night  Follow-up in one week  Check your blood sugar daily in the morning and when you return bring a record of all your blood sugar readings

## 2013-12-31 NOTE — Progress Notes (Signed)
   Subjective:    Patient ID: DAVY FAUGHT, female    DOB: [birth year], 73 y.o.   MRN: [id number]  HPI Miss Mcgaugh is a 73 year old female who comes in today for follow-up of diabetes  Her weight is good 149 pounds. She follows her diet, exercises on a regular basis and is on glipizide 10 mg twice a day. She's not able to tolerate metformin gives her abdominal pain and diarrhea even in small doses. We discussed radius options,,hemoglobin A1c 8.5  We will start her on low-dose insulin  Also labs show that her LDL cholesterol is 176. She is on Pravachol Monday Wednesday Friday advised her to take that daily. We'll follow-up on her lipids once her blood sugar normalizes   Review of Systems Review of systems otherwise negative    Objective:   Physical Exam  Well-developed well-nourished female no acute distress vital signs stable she's afebrile      Assessment & Plan:  Diabetes transition to requiring insulin......... Begin insulin 7525 5 units at bedtime slowly increase every week until her blood sugar drops to normal.

## 2014-01-07 ENCOUNTER — Encounter: Payer: Self-pay | Admitting: Family Medicine

## 2014-01-07 ENCOUNTER — Ambulatory Visit (INDEPENDENT_AMBULATORY_CARE_PROVIDER_SITE_OTHER): Payer: Medicare Other | Admitting: Family Medicine

## 2014-01-07 VITALS — BP 120/80 | Temp 98.3°F | Wt 148.0 lb

## 2014-01-07 DIAGNOSIS — E139 Other specified diabetes mellitus without complications: Secondary | ICD-10-CM

## 2014-01-07 NOTE — Progress Notes (Signed)
Pre visit review using our clinic review tool, if applicable. No additional management support is needed unless otherwise documented below in the visit note. 

## 2014-01-07 NOTE — Progress Notes (Signed)
   Subjective:    Patient ID: Amanda Conway, female    DOB: 07-16-1940, 73 y.o.   MRN: [id number]  HPI  Amanda Conway is a 73 year old female who comes in today for follow-up of diabetes  She's been on glipizide 10 mg twice a day, weight good at 148 however blood sugar 1. She is intolerant of metformin therefore start her on insulin 5 units daily but she could not give herself a shot.  She went on a strict diet and start walking 3 out of 7 days and her blood sugars now in the 110 range.   Review of Systems Review of systems otherwise negative    Objective:   Physical Exam  Well-developed well-nourished female no acute distress vital signs stable she's afebrile      Assessment & Plan:  Diabetes improve control without insulin.......... On a strict diet and exercise program...Marland KitchenMarland KitchenMarland Kitchen Continue diet exercise program check fasting blood sugar follow-up in 2 weeks

## 2014-01-07 NOTE — Patient Instructions (Signed)
Hold the insulin  Continue the strict diet  Walk 30 minutes daily  Check a fasting blood sugar once daily in the morning  Return in 2 weeks for follow-up,,,,,,,,,,, bring a record of all your blood sugar readings with you

## 2014-01-21 ENCOUNTER — Encounter: Payer: Self-pay | Admitting: Family Medicine

## 2014-01-21 ENCOUNTER — Ambulatory Visit (INDEPENDENT_AMBULATORY_CARE_PROVIDER_SITE_OTHER): Payer: Medicare Other | Admitting: Family Medicine

## 2014-01-21 VITALS — BP 110/80 | Temp 98.0°F | Wt 151.0 lb

## 2014-01-21 DIAGNOSIS — E139 Other specified diabetes mellitus without complications: Secondary | ICD-10-CM

## 2014-01-21 MED ORDER — GLIPIZIDE 10 MG PO TABS: 10.0000 mg | ORAL_TABLET | Freq: Two times a day (BID) | ORAL | Status: DC

## 2014-01-21 NOTE — Patient Instructions (Signed)
Check a fasting blood sugar Monday Wednesday Friday  Continue diet and exercise program  Follow-up in 2-1/2 months  Nonfasting labs one week prior

## 2014-01-21 NOTE — Progress Notes (Signed)
Pre visit review using our clinic review tool, if applicable. No additional management support is needed unless otherwise documented below in the visit note. Lab Results  Component Value Date   HGBA1C 8.5* 12/24/2013   HGBA1C 9.2* 10/02/2013   HGBA1C 8.5* 03/01/2012   Lab Results  Component Value Date   MICROALBUR 0.3 12/24/2013   LDLCALC 176* 12/24/2013   CREATININE 0.7 12/24/2013

## 2014-01-21 NOTE — Progress Notes (Signed)
   Subjective:    Patient ID: Amanda Conway, female    DOB: 05-19-1940, 73 y.o.   MRN: [id number]  HPI [name] is a 73 year old female who comes in today for follow-up of diabetes  She had maxed out with her glipizide 10 mg twice a day and cannot take metformin gives abdominal pain and diarrhea. Her A1c had gone to 8.5% and fasting blood sugar 179 with therefore recommended she start on insulin. However she could not take the insulin. We worked with her I showed her how to do it I gave her the shots myself but intellectually she just couldn't take the insulin therefore she decided to go on a strict diet. She's done that over the palpable weeks and her fasting sugars drop down to 120  range she's also lost about 2 pounds   Review of Systems Review of systems otherwise negative    review of systems otherwise negative Objective:   Physical Exam  Well-developed well-nourished female no acute distress vital signs stable she's afebrile weight down to 151 BP at goal 110/80  Fasting blood sugar 123 today      Assessment & Plan:Diabetes type 1 diabetes type 2  Diabetes2............ improve control with diet exercise......... continue that process along with the glipizide 10 mg twice a day follow-up A1c in 2-1/2 months

## 2014-02-12 ENCOUNTER — Telehealth: Payer: Self-pay | Admitting: Family Medicine

## 2014-02-12 NOTE — Telephone Encounter (Signed)
Pharm states pt needs new rx for glucose blood (ACCU-CHEK SMARTVIEW) test strip And ACCU-CHEK FASTCLIX LANCETS MISC Pt states they told her that she needs new rx since something has changed. [facility]/ [address]

## 2014-02-13 ENCOUNTER — Other Ambulatory Visit: Payer: Self-pay | Admitting: *Deleted

## 2014-02-13 MED ORDER — ACCU-CHEK FASTCLIX LANCETS MISC: Status: DC

## 2014-02-13 MED ORDER — GLUCOSE BLOOD VI STRP: ORAL_STRIP | Status: DC

## 2014-04-22 ENCOUNTER — Telehealth: Payer: Self-pay | Admitting: Family Medicine

## 2014-04-22 ENCOUNTER — Other Ambulatory Visit (INDEPENDENT_AMBULATORY_CARE_PROVIDER_SITE_OTHER): Payer: Medicare Other

## 2014-04-22 ENCOUNTER — Other Ambulatory Visit: Payer: Self-pay | Admitting: Family Medicine

## 2014-04-22 ENCOUNTER — Other Ambulatory Visit: Payer: Self-pay | Admitting: *Deleted

## 2014-04-22 DIAGNOSIS — E119 Type 2 diabetes mellitus without complications: Secondary | ICD-10-CM

## 2014-04-22 DIAGNOSIS — E1165 Type 2 diabetes mellitus with hyperglycemia: Secondary | ICD-10-CM

## 2014-04-22 DIAGNOSIS — IMO0002 Reserved for concepts with insufficient information to code with codable children: Secondary | ICD-10-CM

## 2014-04-22 DIAGNOSIS — E785 Hyperlipidemia, unspecified: Secondary | ICD-10-CM

## 2014-04-22 DIAGNOSIS — I10 Essential (primary) hypertension: Secondary | ICD-10-CM

## 2014-04-22 LAB — BASIC METABOLIC PANEL
BUN: 15 mg/dL (ref 6–23)
CHLORIDE: 101 meq/L (ref 96–112)
CO2: 30 meq/L (ref 19–32)
CREATININE: 0.76 mg/dL (ref 0.40–1.20)
Calcium: 9.7 mg/dL (ref 8.4–10.5)
GFR: 95.85 mL/min (ref >60.00)
Glucose, Bld: 185 mg/dL — ABNORMAL HIGH (ref 70–99)
Potassium: 4.5 mEq/L (ref 3.5–5.1)
Sodium: 138 mEq/L (ref 135–145)

## 2014-04-22 LAB — HEMOGLOBIN A1C: Hgb A1c MFr Bld: 8.8 % — ABNORMAL HIGH (ref 4.6–6.5)

## 2014-04-22 MED ORDER — AMLODIPINE BESY-BENAZEPRIL HCL 5-20 MG PO CAPS: 1.0000 | ORAL_CAPSULE | Freq: Every day | ORAL | Status: DC

## 2014-04-22 MED ORDER — PRAVASTATIN SODIUM 10 MG PO TABS: ORAL_TABLET | ORAL | Status: DC

## 2014-04-22 NOTE — Telephone Encounter (Signed)
Amanda Conway is needing her Amlodipine/Benazepri (LOTREL) sent to [place]. She is out of this medication.

## 2014-04-24 NOTE — Telephone Encounter (Signed)
Per [employer], Pt's amLODipine-benazepril (LOTREL) 5-20 MG per capsule has been denied. According to records, pt has only tried one alternative, need to try 2. Either this drug separately or valsartan tabs

## 2014-04-24 NOTE — Telephone Encounter (Signed)
Pt has one pill left

## 2014-04-24 NOTE — Telephone Encounter (Signed)
Please see below.

## 2014-04-25 MED ORDER — AMLODIPINE BESYLATE 5 MG PO TABS: 5.0000 mg | ORAL_TABLET | Freq: Every day | ORAL | Status: DC

## 2014-04-25 MED ORDER — BENAZEPRIL HCL 20 MG PO TABS: 20.0000 mg | ORAL_TABLET | Freq: Every day | ORAL | Status: DC

## 2014-04-25 NOTE — Telephone Encounter (Signed)
Per Dr Sherren Mocha patient should try Norvasc 5 mg daily with benazepril 20 mg daily.  Left message on machine for patient and rx sent to pharmacy

## 2014-04-28 ENCOUNTER — Other Ambulatory Visit: Payer: Self-pay | Admitting: Family Medicine

## 2014-04-28 MED ORDER — BENAZEPRIL HCL 20 MG PO TABS: 20.0000 mg | ORAL_TABLET | Freq: Every day | ORAL | Status: DC

## 2014-04-28 MED ORDER — AMLODIPINE BESYLATE 5 MG PO TABS: 5.0000 mg | ORAL_TABLET | Freq: Every day | ORAL | Status: DC

## 2014-04-28 NOTE — Telephone Encounter (Signed)
Pt needs new rxs amlopidine  5 mg and benzaepril 20 mg # 7 each sent to [facility]. Pt is waiting mailorder

## 2014-04-28 NOTE — Telephone Encounter (Signed)
rx sent in electronically 

## 2014-04-29 ENCOUNTER — Ambulatory Visit (INDEPENDENT_AMBULATORY_CARE_PROVIDER_SITE_OTHER): Payer: Medicare Other | Admitting: Family Medicine

## 2014-04-29 ENCOUNTER — Other Ambulatory Visit: Payer: Self-pay | Admitting: Family Medicine

## 2014-04-29 ENCOUNTER — Encounter: Payer: Self-pay | Admitting: Family Medicine

## 2014-04-29 VITALS — BP 120/78 | Temp 99.0°F | Wt 150.0 lb

## 2014-04-29 DIAGNOSIS — IMO0002 Reserved for concepts with insufficient information to code with codable children: Secondary | ICD-10-CM

## 2014-04-29 DIAGNOSIS — E1165 Type 2 diabetes mellitus with hyperglycemia: Secondary | ICD-10-CM

## 2014-04-29 DIAGNOSIS — I10 Essential (primary) hypertension: Secondary | ICD-10-CM

## 2014-04-29 MED ORDER — BENAZEPRIL HCL 20 MG PO TABS: 20.0000 mg | ORAL_TABLET | Freq: Every day | ORAL | Status: DC

## 2014-04-29 MED ORDER — AMLODIPINE BESYLATE 5 MG PO TABS: 5.0000 mg | ORAL_TABLET | Freq: Every day | ORAL | Status: DC

## 2014-04-29 NOTE — Patient Instructions (Signed)
Lotensin 20 mg,,,,,,,,, 1 daily in the morning  Norvasc 5 mg,,,,,,,, 1 daily in the morning  Continue Glucotrol 10 mg twice daily,,,,,,,, diet and exercise program,,,,,, follow-up in May,,,,,, nonfasting labs one week prior

## 2014-04-29 NOTE — Progress Notes (Signed)
   Subjective:    Patient ID: Amanda Conway, female    DOB: 1940/12/02, 74 y.o.   MRN: [id number]  HPI Amanda Conway is a 74 year old female nonsmoker who comes in today for follow-up of diabetes and hypertension  She is intolerant of metformin gives her diarrhea. She takes Glucotrol 10 mg twice a day. Recent blood sugar 185 A1c 8.8%  She says over the winter she got off her diet and exercise program we talked about the fact that she is currently need insulin at some juncture may be now. She would like to try diet and exercise for now. Will get a follow-up A1c in May then.  Her blood pressures 120/78. She's on a combination of Lotensin 20 mg and Norvasc 5 mg.   Review of Systems    review of systems otherwise negative Objective:   Physical Exam  Well-developed well-nourished female no acute distress vital signs stable she's afebrile      Assessment & Plan:  Hypertension at goal........ continue current medication  Diabetes not at goal..........Marland Kitchen restart diet and exercise program continue current medicine follow-up in May. If A1c not normal by then we'll start insulin

## 2014-04-29 NOTE — Progress Notes (Signed)
Pre visit review using our clinic review tool, if applicable. No additional management support is needed unless otherwise documented below in the visit note. 

## 2014-05-01 ENCOUNTER — Telehealth: Payer: Self-pay | Admitting: Family Medicine

## 2014-05-01 NOTE — Telephone Encounter (Signed)
Pt calling to report she has still not received her medication via mail order yet.  She is asking if she can have samples of this medication amLODipine-benazepril (LOTREL) 5-20 MG per capsule until she gets medication in the mail.  She has been out of medication for a week.

## 2014-05-01 NOTE — Telephone Encounter (Signed)
Spoke with pharmacist and Rx is ready for patient.  Patient is aware.

## 2014-06-24 ENCOUNTER — Other Ambulatory Visit (INDEPENDENT_AMBULATORY_CARE_PROVIDER_SITE_OTHER): Payer: Medicare Other

## 2014-06-24 DIAGNOSIS — E1165 Type 2 diabetes mellitus with hyperglycemia: Secondary | ICD-10-CM

## 2014-06-24 DIAGNOSIS — IMO0002 Reserved for concepts with insufficient information to code with codable children: Secondary | ICD-10-CM

## 2014-06-24 LAB — BASIC METABOLIC PANEL
BUN: 15 mg/dL (ref 6–23)
CALCIUM: 9.7 mg/dL (ref 8.4–10.5)
CO2: 32 meq/L (ref 19–32)
Chloride: 101 mEq/L (ref 96–112)
Creatinine, Ser: 0.71 mg/dL (ref 0.40–1.20)
GFR: 103.63 mL/min (ref >60.00)
Glucose, Bld: 167 mg/dL — ABNORMAL HIGH (ref 70–99)
Potassium: 4.7 mEq/L (ref 3.5–5.1)
SODIUM: 138 meq/L (ref 135–145)

## 2014-06-24 LAB — HEMOGLOBIN A1C: Hgb A1c MFr Bld: 8.4 % — ABNORMAL HIGH (ref 4.6–6.5)

## 2014-07-01 ENCOUNTER — Encounter: Payer: Self-pay | Admitting: Family Medicine

## 2014-07-01 ENCOUNTER — Ambulatory Visit (INDEPENDENT_AMBULATORY_CARE_PROVIDER_SITE_OTHER): Payer: Medicare Other | Admitting: Family Medicine

## 2014-07-01 VITALS — BP 120/80 | Temp 98.6°F | Wt 149.0 lb

## 2014-07-01 DIAGNOSIS — E139 Other specified diabetes mellitus without complications: Secondary | ICD-10-CM

## 2014-07-01 NOTE — Progress Notes (Signed)
Pre visit review using our clinic review tool, if applicable. No additional management support is needed unless otherwise documented below in the visit note. 

## 2014-07-01 NOTE — Progress Notes (Signed)
   Subjective:    Patient ID: Amanda Conway, female    DOB: 1941/01/17, 74 y.o.   MRN: [id number]  HPI Amanda Conway is a 74 year old female nonsmoker who comes in today for follow-up of diabetes  She is been insistent on trying diet and exercise however A1c is still markedly elevated. It was 8.82 months ago now 8.4. She's on Glucotrol 10 mg twice a day cannot take metformin gives abdominal cramps and diarrhea even small doses. I've talked her in the past about adding insulin she adamantly refuses to take insulin.   Review of Systems    review of systems otherwise negative Objective:   Physical Exam Well-developed well-nourished female no acute distress vital signs stable she's afebrile       Assessment & Plan:  Diabetes type 2 not at goal,,,,,,,,,,,,,, consult with endocrinology Dr. Lorie Apley,

## 2014-07-01 NOTE — Patient Instructions (Signed)
Continue diet exercise and oral medication  We will get you a consult with our new endocrinologist Dr. Lorie Apley to discuss other oral options versus insulin

## 2014-07-22 ENCOUNTER — Encounter: Payer: Self-pay | Admitting: Family Medicine

## 2014-09-16 ENCOUNTER — Other Ambulatory Visit: Payer: Self-pay

## 2014-09-16 DIAGNOSIS — Z1231 Encounter for screening mammogram for malignant neoplasm of breast: Secondary | ICD-10-CM

## 2014-09-23 ENCOUNTER — Other Ambulatory Visit: Payer: Self-pay | Admitting: Family Medicine

## 2014-10-08 ENCOUNTER — Encounter: Payer: Self-pay | Admitting: Internal Medicine

## 2014-10-20 ENCOUNTER — Other Ambulatory Visit: Payer: Self-pay | Admitting: Family Medicine

## 2014-10-22 ENCOUNTER — Ambulatory Visit
Admission: RE | Admit: 2014-10-22 | Discharge: 2014-10-22 | Disposition: A | Discharge status: Home / Self Care | Payer: Medicare Other | Source: Ambulatory Visit

## 2014-10-22 DIAGNOSIS — Z1231 Encounter for screening mammogram for malignant neoplasm of breast: Secondary | ICD-10-CM

## 2015-01-27 ENCOUNTER — Other Ambulatory Visit: Payer: Self-pay | Admitting: Family Medicine

## 2015-03-04 ENCOUNTER — Other Ambulatory Visit: Payer: Self-pay | Admitting: Family Medicine

## 2015-03-05 MED ORDER — GLIPIZIDE 10 MG PO TABS: ORAL_TABLET | ORAL | Status: DC

## 2015-03-05 MED ORDER — BENAZEPRIL HCL 20 MG PO TABS: 20.0000 mg | ORAL_TABLET | Freq: Every day | ORAL | Status: DC

## 2015-03-05 MED ORDER — AMLODIPINE BESYLATE 5 MG PO TABS: 5.0000 mg | ORAL_TABLET | Freq: Every day | ORAL | Status: DC

## 2015-03-05 NOTE — Telephone Encounter (Signed)
Rx resent to pharmacy: pt wanted 90 day supply at [facility]

## 2015-03-05 NOTE — Addendum Note (Signed)
Addended by: Colleen Can on: 03/05/2015 10:25 AM   Modules accepted: Orders, Medications

## 2015-03-19 ENCOUNTER — Other Ambulatory Visit (INDEPENDENT_AMBULATORY_CARE_PROVIDER_SITE_OTHER): Payer: Self-pay

## 2015-03-19 DIAGNOSIS — Z0000 Encounter for general adult medical examination without abnormal findings: Secondary | ICD-10-CM

## 2015-03-19 LAB — TSH: TSH: 6.31 u[IU]/mL — AB (ref 0.35–4.50)

## 2015-03-19 LAB — POCT URINALYSIS DIPSTICK
BILIRUBIN UA: NEGATIVE
Glucose, UA: NEGATIVE
NITRITE UA: NEGATIVE
PH UA: 5.5
Protein, UA: NEGATIVE
RBC UA: NEGATIVE
Spec Grav, UA: 1.025
UROBILINOGEN UA: 0.2

## 2015-03-19 LAB — CBC WITH DIFFERENTIAL/PLATELET
BASOS ABS: 0 10*3/uL (ref 0.0–0.1)
Basophils Relative: 0.7 % (ref 0.0–3.0)
EOS PCT: 4.6 % (ref 0.0–5.0)
Eosinophils Absolute: 0.2 10*3/uL (ref 0.0–0.7)
HEMATOCRIT: 40.5 % (ref 36.0–46.0)
HEMOGLOBIN: 13.2 g/dL (ref 12.0–15.0)
LYMPHS PCT: 30.2 % (ref 12.0–46.0)
Lymphs Abs: 1.6 10*3/uL (ref 0.7–4.0)
MCHC: 32.6 g/dL (ref 30.0–36.0)
MCV: 99.8 fl (ref 78.0–100.0)
MONOS PCT: 11 % (ref 3.0–12.0)
Monocytes Absolute: 0.6 10*3/uL (ref 0.1–1.0)
Neutro Abs: 2.8 10*3/uL (ref 1.4–7.7)
Neutrophils Relative %: 53.5 % (ref 43.0–77.0)
Platelets: 403 10*3/uL — ABNORMAL HIGH (ref 150.0–400.0)
RBC: 4.06 Mil/uL (ref 3.87–5.11)
RDW: 14.4 % (ref 11.5–15.5)
WBC: 5.2 10*3/uL (ref 4.0–10.5)

## 2015-03-19 LAB — HEPATIC FUNCTION PANEL
ALBUMIN: 3.9 g/dL (ref 3.5–5.2)
ALK PHOS: 96 U/L (ref 39–117)
ALT: 8 U/L (ref 0–35)
AST: 12 U/L (ref 0–37)
BILIRUBIN TOTAL: 0.6 mg/dL (ref 0.2–1.2)
Bilirubin, Direct: 0.1 mg/dL (ref 0.0–0.3)
Total Protein: 6.5 g/dL (ref 6.0–8.3)

## 2015-03-19 LAB — BASIC METABOLIC PANEL
BUN: 15 mg/dL (ref 6–23)
CALCIUM: 9.3 mg/dL (ref 8.4–10.5)
CO2: 29 mEq/L (ref 19–32)
Chloride: 102 mEq/L (ref 96–112)
Creatinine, Ser: 0.8 mg/dL (ref 0.40–1.20)
GFR: 90.11 mL/min (ref >60.00)
Glucose, Bld: 170 mg/dL — ABNORMAL HIGH (ref 70–99)
POTASSIUM: 4.6 meq/L (ref 3.5–5.1)
SODIUM: 140 meq/L (ref 135–145)

## 2015-03-19 LAB — LIPID PANEL
CHOL/HDL RATIO: 5
CHOLESTEROL: 259 mg/dL — AB (ref 0–200)
HDL: 53 mg/dL (ref >39.00)
LDL Cholesterol: 187 mg/dL — ABNORMAL HIGH (ref 0–99)
NonHDL: 205.54
TRIGLYCERIDES: 91 mg/dL (ref 0.0–149.0)
VLDL: 18.2 mg/dL (ref 0.0–40.0)

## 2015-03-19 LAB — MICROALBUMIN / CREATININE URINE RATIO
CREATININE, U: 159.8 mg/dL
MICROALB/CREAT RATIO: 0.4 mg/g (ref 0.0–30.0)
Microalb, Ur: <0.7 mg/dL (ref 0.0–1.9)

## 2015-03-19 LAB — HEMOGLOBIN A1C: HEMOGLOBIN A1C: 9.5 % — AB (ref 4.6–6.5)

## 2015-03-24 ENCOUNTER — Other Ambulatory Visit: Payer: Self-pay | Admitting: Family Medicine

## 2015-03-24 ENCOUNTER — Telehealth: Payer: Self-pay | Admitting: Family Medicine

## 2015-03-24 DIAGNOSIS — E139 Other specified diabetes mellitus without complications: Secondary | ICD-10-CM

## 2015-03-24 NOTE — Telephone Encounter (Signed)
Patient is returning a call to [facility].  Please call again.

## 2015-03-24 NOTE — Telephone Encounter (Signed)
Patient is aware of lab results and referral placed

## 2015-03-24 NOTE — Telephone Encounter (Signed)
Left message on machine for patient to return our call 

## 2015-03-25 ENCOUNTER — Encounter: Payer: Self-pay | Admitting: Family Medicine

## 2015-03-25 ENCOUNTER — Ambulatory Visit (INDEPENDENT_AMBULATORY_CARE_PROVIDER_SITE_OTHER): Payer: Medicare Other | Admitting: Family Medicine

## 2015-03-25 VITALS — BP 130/80 | Temp 98.5°F | Ht 59.25 in | Wt 149.0 lb

## 2015-03-25 DIAGNOSIS — E139 Other specified diabetes mellitus without complications: Secondary | ICD-10-CM | POA: Diagnosis not present

## 2015-03-25 DIAGNOSIS — E785 Hyperlipidemia, unspecified: Secondary | ICD-10-CM | POA: Diagnosis not present

## 2015-03-25 DIAGNOSIS — Z23 Encounter for immunization: Secondary | ICD-10-CM

## 2015-03-25 DIAGNOSIS — I10 Essential (primary) hypertension: Secondary | ICD-10-CM

## 2015-03-25 MED ORDER — GLIPIZIDE 10 MG PO TABS: ORAL_TABLET | ORAL | Status: DC

## 2015-03-25 MED ORDER — PRAVASTATIN SODIUM 10 MG PO TABS: ORAL_TABLET | ORAL | Status: DC

## 2015-03-25 MED ORDER — AMLODIPINE BESYLATE 5 MG PO TABS: 5.0000 mg | ORAL_TABLET | Freq: Every day | ORAL | Status: DC

## 2015-03-25 MED ORDER — BENAZEPRIL HCL 20 MG PO TABS: 20.0000 mg | ORAL_TABLET | Freq: Every day | ORAL | Status: DC

## 2015-03-25 NOTE — Progress Notes (Signed)
Pre visit review using our clinic review tool, if applicable. No additional management support is needed unless otherwise documented below in the visit note. 

## 2015-03-25 NOTE — Patient Instructions (Addendum)
Continue current medications  Strict carbohydrate free diet  Walk 30 minutes daily  Check your fasting blood sugar daily in the morning  We will set you up with the endocrinology consult ASAP to discuss other options to get your blood sugar back to normal  Tommi Rumps or Almyra Free are 2 new adult nurse practitioner's........Marland Kitchen or Dr. Martin[place].......Marland Kitchen  After you've work with the endocrinologist to normalize your blood sugar set up a time with one of the new 3 people above to get established for long-term care

## 2015-03-25 NOTE — Progress Notes (Signed)
Subjective:    Patient ID: Amanda Conway, female    DOB: 1940/06/26, 75 y.o.   MRN: [id number]  HPI Elenah is a 75 year old female nonsmoker who comes in today for general physical examination because of diabetes  She is on Glucotrol 10 mg twice a day. She is intolerant of metformin gives abdominal pain even in small doses. She's been noncompliant. She's not been checking her blood sugar. She's not been following a diet and she's not been exercising. Weight is good 149. Blood sugar 170 with an A1c of 9.5%. BP 130/80 on Norvasc 5 and Lotensin 20  She also takes an aspirin tablet and 10 mg of Pravachol Monday Wednesday Friday. LDL is 187 not at goal  We have tried in the past 2 offered different medications. As noted above she is intolerant of metformin. We'll try to get her on insulin but she's declined. Her brothers in the hospital now with bypass surgery. She is now interested in following a diet exercise program and considering other options to lower her blood sugar  She gets routine eye care, dental care, and you mammography, colonoscopy 2010 normal.  Vaccinations she declined the flu sat but would agree to take the Pneumovax  Cognitive function normal she does not walk on a daily basis home health safety reviewed no issues identified, no guns in the house, she does not have a healthcare power of attorney nor living well     Review of Systems  Constitutional: Negative.   HENT: Negative.   Eyes: Negative.   Respiratory: Negative.   Cardiovascular: Negative.   Gastrointestinal: Negative.   Endocrine: Negative.   Genitourinary: Negative.   Musculoskeletal: Negative.   Skin: Negative.   Allergic/Immunologic: Negative.   Neurological: Negative.   Hematological: Negative.   Psychiatric/Behavioral: Negative.        Objective:   Physical Exam  Constitutional: She is oriented to person, place, and time. She appears well-developed and well-nourished.  HENT:  Head:  Normocephalic and atraumatic.  Right Ear: External ear normal.  Left Ear: External ear normal.  Nose: Nose normal.  Mouth/Throat: Oropharynx is clear and moist.  Eyes: EOM are normal. Pupils are equal, round, and reactive to light.  Neck: Normal range of motion. Neck supple. No JVD present. No tracheal deviation present. No thyromegaly present.  Cardiovascular: Normal rate, regular rhythm, normal heart sounds and intact distal pulses.  Exam reveals no gallop and no friction rub.   No murmur heard. No carotid nor aortic bruits for full pulses 1+ and symmetrical  Pulmonary/Chest: Effort normal and breath sounds normal. No stridor. No respiratory distress. She has no wheezes. She has no rales. She exhibits no tenderness.  Abdominal: Soft. Bowel sounds are normal. She exhibits no distension and no mass. There is no tenderness. There is no rebound and no guarding.  Genitourinary:  Bilateral breast exam normal  Musculoskeletal: Normal range of motion.  Lymphadenopathy:    She has no cervical adenopathy.  Neurological: She is alert and oriented to person, place, and time. She has normal reflexes. No cranial nerve deficit. She exhibits normal muscle tone. Coordination normal.  No neuropathy  Skin: Skin is warm and dry. No rash noted. No erythema. No pallor.  Psychiatric: She has a normal mood and affect. Her behavior is normal. Judgment and thought content normal.  Nursing note and vitals reviewed.         Assessment & Plan:  Diabetes not at goal on Glucotrol 10 mg twice a day inpatient  intolerant of metformin........... patient agrees this year for further evaluation by endocrinology and discussed instituting insulin  Hypertension at goal.....Marland Kitchen continue current therapy  Hyperlipidemia not at goal,,,,,,,, Pravachol daily,,,,, follow-up lipid panel in 2 months

## 2015-04-07 ENCOUNTER — Other Ambulatory Visit: Payer: Self-pay | Admitting: Family Medicine

## 2015-04-12 ENCOUNTER — Emergency Department (HOSPITAL_COMMUNITY): Payer: Medicare Other

## 2015-04-12 ENCOUNTER — Inpatient Hospital Stay (HOSPITAL_COMMUNITY)
Admission: EM | Admit: 2015-04-12 | Discharge: 2015-04-14 | DRG: 066 | Disposition: A | Discharge status: Home / Self Care | Payer: Medicare Other | Attending: Internal Medicine | Admitting: Internal Medicine

## 2015-04-12 ENCOUNTER — Encounter (HOSPITAL_COMMUNITY): Payer: Self-pay | Admitting: Emergency Medicine

## 2015-04-12 DIAGNOSIS — I6350 Cerebral infarction due to unspecified occlusion or stenosis of unspecified cerebral artery: Secondary | ICD-10-CM | POA: Diagnosis not present

## 2015-04-12 DIAGNOSIS — E1165 Type 2 diabetes mellitus with hyperglycemia: Secondary | ICD-10-CM | POA: Diagnosis present

## 2015-04-12 DIAGNOSIS — Z8639 Personal history of other endocrine, nutritional and metabolic disease: Secondary | ICD-10-CM

## 2015-04-12 DIAGNOSIS — R202 Paresthesia of skin: Secondary | ICD-10-CM | POA: Diagnosis not present

## 2015-04-12 DIAGNOSIS — Z7982 Long term (current) use of aspirin: Secondary | ICD-10-CM

## 2015-04-12 DIAGNOSIS — E118 Type 2 diabetes mellitus with unspecified complications: Secondary | ICD-10-CM | POA: Diagnosis not present

## 2015-04-12 DIAGNOSIS — Z87891 Personal history of nicotine dependence: Secondary | ICD-10-CM

## 2015-04-12 DIAGNOSIS — I6789 Other cerebrovascular disease: Secondary | ICD-10-CM | POA: Diagnosis not present

## 2015-04-12 DIAGNOSIS — E785 Hyperlipidemia, unspecified: Secondary | ICD-10-CM | POA: Diagnosis not present

## 2015-04-12 DIAGNOSIS — I10 Essential (primary) hypertension: Secondary | ICD-10-CM | POA: Diagnosis not present

## 2015-04-12 DIAGNOSIS — I639 Cerebral infarction, unspecified: Principal | ICD-10-CM | POA: Diagnosis present

## 2015-04-12 DIAGNOSIS — E119 Type 2 diabetes mellitus without complications: Secondary | ICD-10-CM

## 2015-04-12 DIAGNOSIS — E039 Hypothyroidism, unspecified: Secondary | ICD-10-CM | POA: Diagnosis present

## 2015-04-12 HISTORY — DX: Thyrotoxicosis, unspecified without thyrotoxic crisis or storm: E05.90

## 2015-04-12 LAB — COMPREHENSIVE METABOLIC PANEL
ALK PHOS: 106 U/L (ref 38–126)
ALT: 13 U/L — AB (ref 14–54)
AST: 15 U/L (ref 15–41)
Albumin: 4.1 g/dL (ref 3.5–5.0)
Anion gap: 11 (ref 5–15)
BUN: 11 mg/dL (ref 6–20)
CALCIUM: 9.6 mg/dL (ref 8.9–10.3)
CO2: 27 mmol/L (ref 22–32)
CREATININE: 0.58 mg/dL (ref 0.44–1.00)
Chloride: 104 mmol/L (ref 101–111)
Glucose, Bld: 181 mg/dL — ABNORMAL HIGH (ref 65–99)
Potassium: 3.8 mmol/L (ref 3.5–5.1)
Sodium: 142 mmol/L (ref 135–145)
Total Bilirubin: 0.6 mg/dL (ref 0.3–1.2)
Total Protein: 7.1 g/dL (ref 6.5–8.1)

## 2015-04-12 LAB — CBC WITH DIFFERENTIAL/PLATELET
BASOS PCT: 0 %
Basophils Absolute: 0 10*3/uL (ref 0.0–0.1)
EOS ABS: 0.3 10*3/uL (ref 0.0–0.7)
Eosinophils Relative: 4 %
HCT: 41.7 % (ref 36.0–46.0)
HEMOGLOBIN: 14.2 g/dL (ref 12.0–15.0)
Lymphocytes Relative: 38 %
Lymphs Abs: 2.6 10*3/uL (ref 0.7–4.0)
MCH: 32.7 pg (ref 26.0–34.0)
MCHC: 34.1 g/dL (ref 30.0–36.0)
MCV: 96.1 fL (ref 78.0–100.0)
MONOS PCT: 8 %
Monocytes Absolute: 0.5 10*3/uL (ref 0.1–1.0)
NEUTROS PCT: 50 %
Neutro Abs: 3.4 10*3/uL (ref 1.7–7.7)
PLATELETS: 392 10*3/uL (ref 150–400)
RBC: 4.34 MIL/uL (ref 3.87–5.11)
RDW: 13.2 % (ref 11.5–15.5)
WBC: 6.8 10*3/uL (ref 4.0–10.5)

## 2015-04-12 LAB — URINALYSIS, ROUTINE W REFLEX MICROSCOPIC
BILIRUBIN URINE: NEGATIVE
Glucose, UA: >1000 mg/dL — AB
HGB URINE DIPSTICK: NEGATIVE
KETONES UR: NEGATIVE mg/dL
Nitrite: NEGATIVE
PROTEIN: NEGATIVE mg/dL
Specific Gravity, Urine: 1.017 (ref 1.005–1.030)
pH: 5.5 (ref 5.0–8.0)

## 2015-04-12 LAB — URINE MICROSCOPIC-ADD ON: RBC / HPF: NONE SEEN RBC/hpf (ref 0–5)

## 2015-04-12 LAB — TROPONIN I

## 2015-04-12 MED ORDER — OMEGA-3-ACID ETHYL ESTERS 1 G PO CAPS
1.0000 g | ORAL_CAPSULE | ORAL | Status: DC
  Administered 2015-04-12: 1 g via ORAL
  Filled 2015-04-12: qty 1

## 2015-04-12 MED ORDER — SENNOSIDES-DOCUSATE SODIUM 8.6-50 MG PO TABS: 1.0000 | ORAL_TABLET | Freq: Every evening | ORAL | Status: DC | PRN

## 2015-04-12 MED ORDER — INSULIN ASPART 100 UNIT/ML ~~LOC~~ SOLN
0.0000 [IU] | Freq: Three times a day (TID) | SUBCUTANEOUS | Status: DC
  Administered 2015-04-13: 1 [IU] via SUBCUTANEOUS

## 2015-04-12 MED ORDER — PRAVASTATIN SODIUM 20 MG PO TABS
10.0000 mg | ORAL_TABLET | Freq: Every day | ORAL | Status: DC
  Administered 2015-04-12 – 2015-04-13 (×2): 10 mg via ORAL
  Filled 2015-04-12 (×3): qty 1

## 2015-04-12 MED ORDER — ASPIRIN EC 325 MG PO TBEC
325.0000 mg | DELAYED_RELEASE_TABLET | Freq: Every day | ORAL | Status: DC
  Administered 2015-04-12 – 2015-04-14 (×3): 325 mg via ORAL
  Filled 2015-04-12 (×3): qty 1

## 2015-04-12 MED ORDER — STROKE: EARLY STAGES OF RECOVERY BOOK
Freq: Once | Status: AC
  Administered 2015-04-12: 22:00:00
  Filled 2015-04-12: qty 1

## 2015-04-12 MED ORDER — ADULT MULTIVITAMIN W/MINERALS CH
1.0000 | ORAL_TABLET | Freq: Every day | ORAL | Status: DC
  Administered 2015-04-12 – 2015-04-13 (×2): 1 via ORAL
  Filled 2015-04-12 (×2): qty 1

## 2015-04-12 MED ORDER — ASPIRIN 81 MG PO CHEW
324.0000 mg | CHEWABLE_TABLET | Freq: Once | ORAL | Status: AC
  Administered 2015-04-12: 324 mg via ORAL
  Filled 2015-04-12: qty 4

## 2015-04-12 MED ORDER — LORAZEPAM 2 MG/ML IJ SOLN
1.0000 mg | Freq: Once | INTRAMUSCULAR | Status: AC
  Administered 2015-04-12: 1 mg via INTRAVENOUS
  Filled 2015-04-12: qty 1

## 2015-04-12 MED ORDER — BENAZEPRIL HCL 20 MG PO TABS
20.0000 mg | ORAL_TABLET | Freq: Every day | ORAL | Status: DC
  Administered 2015-04-12: 20 mg via ORAL
  Filled 2015-04-12 (×2): qty 1

## 2015-04-12 MED ORDER — AMLODIPINE BESYLATE 5 MG PO TABS
5.0000 mg | ORAL_TABLET | Freq: Every day | ORAL | Status: DC
  Administered 2015-04-12: 5 mg via ORAL
  Filled 2015-04-12 (×3): qty 1

## 2015-04-12 MED ORDER — ASPIRIN 81 MG PO TABS: 81.0000 mg | ORAL_TABLET | ORAL | Status: DC

## 2015-04-12 MED ORDER — ENOXAPARIN SODIUM 40 MG/0.4ML ~~LOC~~ SOLN
40.0000 mg | Freq: Every day | SUBCUTANEOUS | Status: DC
  Administered 2015-04-12 – 2015-04-13 (×2): 40 mg via SUBCUTANEOUS
  Filled 2015-04-12 (×3): qty 0.4

## 2015-04-12 NOTE — ED Notes (Signed)
Patient transported to MRI 

## 2015-04-12 NOTE — ED Notes (Signed)
Pt states she woke up in the middle of the night last night with tingling all down her right side that has continued into today. Denies any pain or dizziness. No focal neuro deficits observed in triage. Equal and strong pulse, movement, equal sensation intact in all extremities. Not on blood thinners, did not hit head, has not lost consciousness.

## 2015-04-12 NOTE — ED Notes (Signed)
Pt returned from MRI. Family at bedside. Pt comfortable.

## 2015-04-12 NOTE — H&P (Signed)
[facility] History and Physical  Amanda Conway [id number] DOB: 11/01/1940 DOA: 04/12/2015  Referring physician: ED PCP: Joycelyn Man, MD   Chief Complaint:  Right-sided numbness  HPI:  Amanda Conway  is a 75 year old female past medical history significant for HTN , HLD, hypothyroidism previously treated,  and diabetes mellitus type 2; who presents with complaints of right-sided numbness. Patient was last seen normal prior to going to bed last night. She reports waking up this morning around 5 AM with numbness sensations along the entire right side of her body. Patient states symptoms have persisted throughout the day  Except for facial numbness appears to have resolved. Denies any headache, fever, chills, shortness of breath, diarrhea, or nausea. Daughter notes that she complained of feeling like her heart was racing upon interpreting the hospital. She reports last week she was told at the doctor's office that her thyroid was beginning to act up again pain patient notes that she has previously been diagnosed with hyperthyroidism at the age of 49 but took some new investigational drug that had resolved that issue in the past. 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 Neurology was consulted and recommended patient be transferred to [facility] for further evaluation and stroke workup.   Review of Systems  Constitutional: Negative for fever, chills, weight loss, malaise/fatigue and diaphoresis.  HENT: Negative for ear pain and tinnitus.   Eyes: Negative for photophobia and pain.  Respiratory: Negative for hemoptysis, sputum production and shortness of breath.   Cardiovascular: Positive for palpitations. Negative for chest pain.  Gastrointestinal: Negative for nausea, vomiting and abdominal pain.  Genitourinary: Negative for urgency and frequency.   Musculoskeletal: Positive for joint pain. Negative for neck pain.  Skin: Negative for itching and rash.  Neurological: Positive for sensory change. Negative for focal weakness, seizures and weakness.  Endo/Heme/Allergies: Negative for environmental allergies and polydipsia.  Psychiatric/Behavioral: Negative for hallucinations and substance abuse.    Past Medical History  Diagnosis Date  . Diabetes mellitus   . Hypertension   . High cholesterol      Past Surgical History  Procedure Laterality Date  . Bilateral rotator cuff repair        Social History:  reports that she quit smoking about 44 years ago. Her smoking use included Cigarettes. She does not have any smokeless tobacco history on file. She reports that she does not drink alcohol or use illicit drugs. Where does patient live--home    and with whom if at home? with daughter Can patient participate in ADLs?Yes  Allergies  Allergen Reactions  . Banana     Lungs collapsed   . Corn-Containing Products     Tongue swells, pt's tolerated recently  . Fish Allergy Itching    In large amounts  . Sulfonamide Derivatives     REACTION: family hx    Family History  Problem Relation Age of Onset  . Diabetes Mother   . Cancer Father       Prior to Admission medications   Medication Sig Start Date End Date Taking? Authorizing Provider  amLODipine (NORVASC) 5 MG tablet Take 1 tablet (5 mg total) by mouth daily. 03/25/15  Yes Dorena Cookey, MD  aspirin 81 MG tablet Take 81 mg by mouth 2 (two) times a week.    Yes Historical Provider, MD  benazepril (LOTENSIN) 20 MG tablet Take 1 tablet (20  mg total) by mouth daily. 03/25/15  Yes Dorena Cookey, MD  EPINEPHrine (EPI-PEN) 0.3 mg/0.3 mL DEVI Inject 0.3 mLs (0.3 mg total) into the muscle once. 10/06/11  Yes Dorena Cookey, MD  glipiZIDE (GLUCOTROL) 10 MG tablet TAKE 1 TABLET BY MOUTH TWICE DAILY BEFORE A MEAL 04/08/15  Yes Dorena Cookey, MD  Multiple Vitamins-Minerals (MULTIVITAL PO)  Take by mouth.   Yes Historical Provider, MD  Nutritional Supplements (JOINT FORMULA PO) Take by mouth. Joint juice   Yes Historical Provider, MD  Omega-3 Fatty Acids (FISH OIL) 1200 MG CPDR Take 1 capsule by mouth 3 (three) times a week.    Yes Historical Provider, MD  pravastatin (PRAVACHOL) 10 MG tablet 1 tablet daily 03/25/15  Yes Dorena Cookey, MD  ACCU-CHEK FASTCLIX LANCETS MISC Dx E11.9 test once daily 02/13/14   Dorena Cookey, MD  glucose blood (ACCU-CHEK SMARTVIEW) test strip E11.9, test once daily 02/13/14   Dorena Cookey, MD     Physical Exam: Filed Vitals:   04/12/15 1236 04/12/15 1730 04/12/15 1740 04/12/15 1902  BP: 169/71  179/79 126/66  Pulse: 90  89   Temp: 98.2 F (36.8 C) 98.2 F (36.8 C)    TempSrc: Oral     Resp: 18  17   SpO2: 97%  96%      Constitutional: Vital signs reviewed. Patient is a well-developed and well-nourished in no acute distress and cooperative with exam. Alert and oriented x3.  Head: Normocephalic and atraumatic  Ear: TM normal bilaterally  Mouth: no erythema or exudates, MMM  Eyes: PERRL, EOMI, conjunctivae normal, No scleral icterus.  Neck: Supple, Trachea midline normal ROM, No JVD, mass, thyromegaly, or carotid bruit present.  Cardiovascular: RRR, S1 normal, S2 normal, no MRG, pulses symmetric and intact bilaterally  Pulmonary/Chest: CTAB, no wheezes, rales, or rhonchi  Abdominal: Soft. Non-tender, non-distended, bowel sounds are normal, no masses, organomegaly, or guarding present.  GU: no CVA tenderness Musculoskeletal: No joint deformities, erythema, or stiffness, ROM full and no nontender Ext: no edema and no cyanosis, pulses palpable bilaterally (DP and PT)  Hematology: no cervical, inginal, or axillary adenopathy.  Neurological: A&O x3, Strenght is normal and symmetric bilaterally, cranial nerve II-XII are grossly intact, no focal motor deficit,  Decreased sensation on the right side of body including upper and lower  extremities. Skin: Warm, dry and intact. No rash, cyanosis, or clubbing.  Psychiatric: Normal mood and affect. speech and behavior is normal. Judgment and thought content normal. Cognition and memory are normal.      Data Review   Micro Results No results found for this or any previous visit (from the past 240 hour(s)).  Radiology Reports Mr [name] Wo Contrast  04/12/2015  CLINICAL DATA:  Awoke last night with tingling in the right side of the body. Symptoms are persistent today. EXAM: MRI HEAD WITHOUT CONTRAST MRA HEAD WITHOUT CONTRAST TECHNIQUE: Multiplanar, multiecho pulse sequences of the brain and surrounding structures were obtained without intravenous contrast. Angiographic images of the head were obtained using MRA technique without contrast. COMPARISON:  02/28/2011 FINDINGS: MRI HEAD FINDINGS There is a 6 mm acute infarction in the left lateral thalamus. No other acute infarction. The brainstem is normal. No cerebellar insult. Cerebral hemispheres show mild to moderate chronic small-vessel ischemic changes of the deep and subcortical white matter. No large vessel territory infarction. No mass lesion, hemorrhage, hydrocephalus or extra-axial collection. No pituitary mass. No significant sinus disease. MRA HEAD FINDINGS Both internal carotid  arteries are widely patent through the skullbase and siphon regions. 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 Supra clinoid internal carotid arteries are normal. The anterior and middle cerebral arteries are widely patent. Distal vessels do show atherosclerotic irregularity. Both vertebral arteries are widely patent to the basilar. No vertebral or basilar stenosis. Posterior circulation branch vessels are patent but do show some atherosclerotic irregularity. No flow limiting stenosis. IMPRESSION: 6 mm acute infarction left lateral thalamus.  Moderate chronic small vessel ischemic changes elsewhere. No large or medium vessel occlusion or correctable proximal stenosis. Atherosclerotic irregularity of the more distal branch vessels diffusely. 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 Electronically Signed   By: Nelson Chimes M.D.   On: 04/12/2015 18:46   Mr Brain Wo Contrast  04/12/2015  CLINICAL DATA:  Awoke last night with tingling in the right side of the body. Symptoms are persistent today. EXAM: MRI HEAD WITHOUT CONTRAST MRA HEAD WITHOUT CONTRAST TECHNIQUE: Multiplanar, multiecho pulse sequences of the brain and surrounding structures were obtained without intravenous contrast. Angiographic images of the head were obtained using MRA technique without contrast. COMPARISON:  02/28/2011 FINDINGS: MRI HEAD FINDINGS There is a 6 mm acute infarction in the left lateral thalamus. No other acute infarction. The brainstem is normal. No cerebellar insult. Cerebral hemispheres show mild to moderate chronic small-vessel ischemic changes of the deep and subcortical white matter. No large vessel territory infarction. No mass lesion, hemorrhage, hydrocephalus or extra-axial collection. No pituitary mass. No significant sinus disease. MRA HEAD FINDINGS Both internal carotid arteries are widely patent through the skullbase and siphon regions. 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 Supra clinoid internal carotid arteries are normal. The anterior and middle cerebral arteries are widely patent. Distal vessels do show atherosclerotic irregularity. Both vertebral arteries are widely patent to the basilar. No vertebral or basilar stenosis. Posterior circulation branch vessels are patent but do show some atherosclerotic irregularity. No flow limiting stenosis. IMPRESSION: 6 mm acute infarction left  lateral thalamus. Moderate chronic small vessel ischemic changes elsewhere. No large or medium vessel occlusion or correctable proximal stenosis. Atherosclerotic irregularity of the more distal branch vessels diffusely. 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 Electronically Signed   By: Nelson Chimes M.D.   On: 04/12/2015 18:46     CBC  Recent Labs Lab 04/12/15 1800  WBC 6.8  HGB 14.2  HCT 41.7  PLT 392  MCV 96.1  MCH 32.7  MCHC 34.1  RDW 13.2  LYMPHSABS 2.6  MONOABS 0.5  EOSABS 0.3  BASOSABS 0.0    Chemistries   Recent Labs Lab 04/12/15 1800  NA 142  K 3.8  CL 104  CO2 27  GLUCOSE 181*  BUN 11  CREATININE 0.58  CALCIUM 9.6  AST 15  ALT 13*  ALKPHOS 106  BILITOT 0.6   ------------------------------------------------------------------------------------------------------------------ estimated creatinine clearance is 51.9 mL/min (by C-G formula based on Cr of 0.58). ------------------------------------------------------------------------------------------------------------------ No results for input(s): HGBA1C in the last 72 hours. ------------------------------------------------------------------------------------------------------------------ No results for input(s): CHOL, HDL, LDLCALC, TRIG, CHOLHDL, LDLDIRECT in the last 72 hours. ------------------------------------------------------------------------------------------------------------------ No results for input(s): TSH, T4TOTAL, T3FREE, THYROIDAB in the last 72 hours.  Invalid input(s): FREET3 ------------------------------------------------------------------------------------------------------------------ No results for input(s): VITAMINB12, FOLATE, FERRITIN, TIBC, IRON,  RETICCTPCT in the last 72 hours.  Coagulation profile No results for input(s): INR, PROTIME in the last 168 hours.  No results for input(s): DDIMER in the last 72  hours.  Cardiac Enzymes  Recent Labs Lab 04/12/15 1800  TROPONINI <0.03   ------------------------------------------------------------------------------------------------------------------ Invalid input(s): POCBNP   CBG: No results for input(s): GLUCAP in the last 168 hours.     EKG: Independently reviewed.  Sinus rhythm similar to previous tracing   Assessment/Plan Principal Problem:   CVA (cerebral infarction): patient with acute onset of symptoms of right-sided whole-body numbness starting around 5 AM this morning. Physical exam reveals decreased sensation along the right side. MRI/ MRA shows a 6 mm left lateral thalamus infarct. - Admitted to a telemetry bed per  Dr. Katherine Roan of Neurology - echo / carotid duplex doppler ultrasound - PT/OT/ SP to eval and treat - Neuro checks - follow-up telemetry overnight -  Aspirin 324 mg daily -  Follow-up neurology recommendations    Essential hypertension - continue home medications of benazepril  Type 2 diabetes mellitus (HCC)  -  Hold oral  medications of Glipizide -  CBGs every before meals with a sensitive sliding scale insulin -  Hypoglycemic protocol  History of hyperthyroidism:  Patient recently told that thyroid levels were beginning  To be abnormal. -  Check free T4  Hyperlipidemia -  Follow-up lipid panel -  Continue pravastatin for now recommend changing to high-dose statin like Atorvastatin or simvastatin   Code Status:   full Family Communication: bedside Disposition Plan: admit   Total time spent 55 minutes.Greater than 50% of this time was spent in counseling, explanation of diagnosis, planning of further management, and coordination of care  [facility] Pager [phone]  If 7PM-7AM, please contact night-coverage [email or web address] Password [facility] 04/12/2015, 8:35 PM

## 2015-04-12 NOTE — Progress Notes (Signed)
Report received from Metta Clines, RN from [facility] ED. Awaiting pt's arrival.

## 2015-04-12 NOTE — ED Provider Notes (Signed)
CSN: [id number]     Arrival date & time 04/12/15  1227 History   First MD Initiated Contact with Patient 04/12/15 1620     Chief Complaint  Patient presents with  . Tingling     (Consider location/radiation/quality/duration/timing/severity/associated sxs/prior Treatment) The history is provided by the patient.   75 year old female woke up this morning with numbness in her right arm and leg and also in the right side of her face. The numbness is persisted although the facial numbness is resolved. Numbness is moderate. She denies any weakness. She denies headache and she has noted a tight feeling in the right upper arm and into the right scapular area. No nausea or vomiting and no chest pain. Nothing makes it better nothing makes it worse. She was last known normal when she went to bed at about 11 PM.  Past Medical History  Diagnosis Date  . Diabetes mellitus   . Hypertension   . High cholesterol    Past Surgical History  Procedure Laterality Date  . Bilateral rotator cuff repair     Family History  Problem Relation Age of Onset  . Diabetes Mother   . Cancer Father    Social History  Substance Use Topics  . Smoking status: Former Smoker    Types: Cigarettes    Quit date: 02/28/1971  . Smokeless tobacco: None  . Alcohol Use: No   OB History    No data available     Review of Systems  All other systems reviewed and are negative.     Allergies  Banana; Corn-containing products; Fish allergy; and Sulfonamide derivatives  Home Medications   Prior to Admission medications   Medication Sig Start Date End Date Taking? Authorizing Provider  ACCU-CHEK FASTCLIX LANCETS MISC Dx E11.9 test once daily 02/13/14   Dorena Cookey, MD  amLODipine (NORVASC) 5 MG tablet Take 1 tablet (5 mg total) by mouth daily. 03/25/15   Dorena Cookey, MD  aspirin 81 MG tablet Take 81 mg by mouth daily.      Historical Provider, MD  benazepril (LOTENSIN) 20 MG tablet Take 1 tablet (20 mg total) by  mouth daily. 03/25/15   Dorena Cookey, MD  EPINEPHrine (EPI-PEN) 0.3 mg/0.3 mL DEVI Inject 0.3 mLs (0.3 mg total) into the muscle once. 10/06/11   Dorena Cookey, MD  glipiZIDE (GLUCOTROL) 10 MG tablet TAKE 1 TABLET BY MOUTH TWICE DAILY BEFORE A MEAL 04/08/15   Dorena Cookey, MD  glucose blood (ACCU-CHEK SMARTVIEW) test strip E11.9, test once daily 02/13/14   Dorena Cookey, MD  Multiple Vitamins-Minerals (MULTIVITAL PO) Take by mouth.    Historical Provider, MD  Nutritional Supplements (JOINT FORMULA PO) Take by mouth. Joint juice    Historical Provider, MD  Omega-3 Fatty Acids (FISH OIL) 1200 MG CPDR Take by mouth.    Historical Provider, MD  pravastatin (PRAVACHOL) 10 MG tablet 1 tablet daily 03/25/15   Dorena Cookey, MD   BP 169/71 mmHg  Pulse 90  Temp(Src) 98.2 F (36.8 C) (Oral)  Resp 18  SpO2 97% Physical Exam  Nursing note and vitals reviewed.  75 year old female, resting comfortably and in no acute distress. Vital signs are significant for hypertension. Oxygen saturation is 97%, which is normal. Head is normocephalic and atraumatic. PERRLA, EOMI. Oropharynx is clear. Neck is nontender and supple without adenopathy or JVD. There are no carotid bruits. Back is nontender and there is no CVA tenderness. Lungs are clear without rales,  wheezes, or rhonchi. Chest is nontender. Heart has regular rate and rhythm without murmur. Abdomen is soft, flat, nontender without masses or hepatosplenomegaly and peristalsis is normoactive. Extremities have no cyanosis or edema, full range of motion is present. Skin is warm and dry without rash. Neurologic: Mental status is normal, cranial nerves are intact, there are no motor deficits. Strength is 5/5 in both arms and both legs. She is right-handed and there is a sense that the left side might be slightly stronger than the right. There is questionable right sided pronator drift. Decreased sensation is present in the right arm and leg but not over the  trunk and not on the face. There is no extinction on double simultaneous stimulation.   ED Course  Procedures (including critical care time) Labs Review Results for orders placed or performed during the hospital encounter of 04/12/15  Comprehensive metabolic panel  Result Value Ref Range   Sodium 142 135 - 145 mmol/L   Potassium 3.8 3.5 - 5.1 mmol/L   Chloride 104 101 - 111 mmol/L   CO2 27 22 - 32 mmol/L   Glucose, Bld 181 (H) 65 - 99 mg/dL   BUN 11 6 - 20 mg/dL   Creatinine, Ser 0.58 0.44 - 1.00 mg/dL   Calcium 9.6 8.9 - 10.3 mg/dL   Total Protein 7.1 6.5 - 8.1 g/dL   Albumin 4.1 3.5 - 5.0 g/dL   AST 15 15 - 41 U/L   ALT 13 (L) 14 - 54 U/L   Alkaline Phosphatase 106 38 - 126 U/L   Total Bilirubin 0.6 0.3 - 1.2 mg/dL   GFR calc non Af Amer >60 >60 mL/min   GFR calc Af Amer >60 >60 mL/min   Anion gap 11 5 - 15  CBC with Differential  Result Value Ref Range   WBC 6.8 4.0 - 10.5 K/uL   RBC 4.34 3.87 - 5.11 MIL/uL   Hemoglobin 14.2 12.0 - 15.0 g/dL   HCT 41.7 36.0 - 46.0 %   MCV 96.1 78.0 - 100.0 fL   MCH 32.7 26.0 - 34.0 pg   MCHC 34.1 30.0 - 36.0 g/dL   RDW 13.2 11.5 - 15.5 %   Platelets 392 150 - 400 K/uL   Neutrophils Relative % 50 %   Neutro Abs 3.4 1.7 - 7.7 K/uL   Lymphocytes Relative 38 %   Lymphs Abs 2.6 0.7 - 4.0 K/uL   Monocytes Relative 8 %   Monocytes Absolute 0.5 0.1 - 1.0 K/uL   Eosinophils Relative 4 %   Eosinophils Absolute 0.3 0.0 - 0.7 K/uL   Basophils Relative 0 %   Basophils Absolute 0.0 0.0 - 0.1 K/uL  Troponin I  Result Value Ref Range   Troponin I <0.03 <0.031 ng/mL  Urinalysis, Routine w reflex microscopic  Result Value Ref Range   Color, Urine YELLOW YELLOW   APPearance CLEAR CLEAR   Specific Gravity, Urine 1.017 1.005 - 1.030   pH 5.5 5.0 - 8.0   Glucose, UA >1000 (A) NEGATIVE mg/dL   Hgb urine dipstick NEGATIVE NEGATIVE   Bilirubin Urine NEGATIVE NEGATIVE   Ketones, ur NEGATIVE NEGATIVE mg/dL   Protein, ur NEGATIVE NEGATIVE mg/dL    Nitrite NEGATIVE NEGATIVE   Leukocytes, UA SMALL (A) NEGATIVE  Urine microscopic-add on  Result Value Ref Range   Squamous Epithelial / LPF 0-5 (A) NONE SEEN   WBC, UA 0-5 0 - 5 WBC/hpf   RBC / HPF NONE SEEN 0 - 5 RBC/hpf  Bacteria, UA RARE (A) NONE SEEN   Imaging Review Mr [name] Wo Contrast  04/12/2015  CLINICAL DATA:  Awoke last night with tingling in the right side of the body. Symptoms are persistent today. EXAM: MRI HEAD WITHOUT CONTRAST MRA HEAD WITHOUT CONTRAST TECHNIQUE: Multiplanar, multiecho pulse sequences of the brain and surrounding structures were obtained without intravenous contrast. Angiographic images of the head were obtained using MRA technique without contrast. COMPARISON:  02/28/2011 FINDINGS: MRI HEAD FINDINGS There is a 6 mm acute infarction in the left lateral thalamus. No other acute infarction. The brainstem is normal. No cerebellar insult. Cerebral hemispheres show mild to moderate chronic small-vessel ischemic changes of the deep and subcortical white matter. No large vessel territory infarction. No mass lesion, hemorrhage, hydrocephalus or extra-axial collection. No pituitary mass. No significant sinus disease. MRA HEAD FINDINGS Both internal carotid arteries are widely patent through the skullbase and siphon regions. 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 Supra clinoid internal carotid arteries are normal. The anterior and middle cerebral arteries are widely patent. Distal vessels do show atherosclerotic irregularity. Both vertebral arteries are widely patent to the basilar. No vertebral or basilar stenosis. Posterior circulation branch vessels are patent but do show some atherosclerotic irregularity. No flow limiting stenosis. IMPRESSION: 6 mm acute infarction left lateral thalamus. Moderate chronic small vessel ischemic changes elsewhere. No large or medium  vessel occlusion or correctable proximal stenosis. Atherosclerotic irregularity of the more distal branch vessels diffusely. 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 Electronically Signed   By: Nelson Chimes M.D.   On: 04/12/2015 18:46   Mr Brain Wo Contrast  04/12/2015  CLINICAL DATA:  Awoke last night with tingling in the right side of the body. Symptoms are persistent today. EXAM: MRI HEAD WITHOUT CONTRAST MRA HEAD WITHOUT CONTRAST TECHNIQUE: Multiplanar, multiecho pulse sequences of the brain and surrounding structures were obtained without intravenous contrast. Angiographic images of the head were obtained using MRA technique without contrast. COMPARISON:  02/28/2011 FINDINGS: MRI HEAD FINDINGS There is a 6 mm acute infarction in the left lateral thalamus. No other acute infarction. The brainstem is normal. No cerebellar insult. Cerebral hemispheres show mild to moderate chronic small-vessel ischemic changes of the deep and subcortical white matter. No large vessel territory infarction. No mass lesion, hemorrhage, hydrocephalus or extra-axial collection. No pituitary mass. No significant sinus disease. MRA HEAD FINDINGS Both internal carotid arteries are widely patent through the skullbase and siphon regions. 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 Supra clinoid internal carotid arteries are normal. The anterior and middle cerebral arteries are widely patent. Distal vessels do show atherosclerotic irregularity. Both vertebral arteries are widely patent to the basilar. No vertebral or basilar stenosis. Posterior circulation branch vessels are patent but do show some atherosclerotic irregularity. No flow limiting stenosis. IMPRESSION: 6 mm acute infarction left lateral thalamus. Moderate chronic small vessel ischemic changes elsewhere.  No large or medium vessel occlusion or correctable proximal stenosis. Atherosclerotic irregularity of the more distal branch vessels diffusely. 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 Electronically Signed   By: Nelson Chimes M.D.   On: 04/12/2015 18:46   I have personally reviewed and evaluated these images and lab results as part of my medical decision-making.  EKG Interpretation   Date/Time:  Sunday April 12 2015 17:35:34 EST Ventricular Rate:  86 PR Interval:  175 QRS Duration: 94 QT Interval:  381 QTC Calculation: 456 R Axis:   10 Text Interpretation:  Sinus rhythm RSR' in V1 or V2, right VCD or RVH  Inferior infarct, age indeterminate When compared with ECG of 02/28/2011, No  significant change was found Confirmed by [facility]  MD, Maghan Jessee ([id number]) on  04/12/2015 5:40:11 PM      MDM   Final diagnoses:  Cerebrovascular accident (CVA) due to occlusion of cerebral artery (HCC)    Right-sided numbness worrisome for stroke. Old records reviewed and she had been admitted to the hospital 4 years ago with similar symptoms but on the left side. At that time, MRI was negative. No definite cause for the numbness was found. She will be sent for MRI to evaluate for possibility of stroke. Because her last known normal time was greater than 12 hours ago, she does not qualify for code stroke and would not be a candidate for any acute stroke intervention.  MRI confirms presence of a small stroke. MRA is also noted to find a atherosclerotic aneurysm on the left. Case is discussed with Dr. Tamala Julian [facility] who agrees to admit the patient. Consultation is requested with neurology.  Delora Fuel, MD [phone] [id number]

## 2015-04-13 ENCOUNTER — Other Ambulatory Visit (HOSPITAL_COMMUNITY): Payer: Medicare Other

## 2015-04-13 ENCOUNTER — Inpatient Hospital Stay (HOSPITAL_COMMUNITY): Payer: Medicare Other

## 2015-04-13 DIAGNOSIS — I639 Cerebral infarction, unspecified: Principal | ICD-10-CM

## 2015-04-13 DIAGNOSIS — I6350 Cerebral infarction due to unspecified occlusion or stenosis of unspecified cerebral artery: Secondary | ICD-10-CM | POA: Insufficient documentation

## 2015-04-13 DIAGNOSIS — I6789 Other cerebrovascular disease: Secondary | ICD-10-CM

## 2015-04-13 LAB — GLUCOSE, CAPILLARY
GLUCOSE-CAPILLARY: 126 mg/dL — AB (ref 65–99)
GLUCOSE-CAPILLARY: 150 mg/dL — AB (ref 65–99)
GLUCOSE-CAPILLARY: 175 mg/dL — AB (ref 65–99)
GLUCOSE-CAPILLARY: 89 mg/dL (ref 65–99)
Glucose-Capillary: 200 mg/dL — ABNORMAL HIGH (ref 65–99)

## 2015-04-13 MED ORDER — INSULIN ASPART 100 UNIT/ML ~~LOC~~ SOLN
3.0000 [IU] | Freq: Three times a day (TID) | SUBCUTANEOUS | Status: DC
  Administered 2015-04-13 – 2015-04-14 (×3): 3 [IU] via SUBCUTANEOUS

## 2015-04-13 MED ORDER — INSULIN ASPART 100 UNIT/ML ~~LOC~~ SOLN
0.0000 [IU] | Freq: Three times a day (TID) | SUBCUTANEOUS | Status: DC
  Administered 2015-04-13: 2 [IU] via SUBCUTANEOUS
  Administered 2015-04-14: 1 [IU] via SUBCUTANEOUS
  Administered 2015-04-14: 2 [IU] via SUBCUTANEOUS

## 2015-04-13 MED ORDER — AMLODIPINE BESYLATE 5 MG PO TABS
5.0000 mg | ORAL_TABLET | Freq: Every day | ORAL | Status: DC
  Administered 2015-04-13: 5 mg via ORAL
  Filled 2015-04-13: qty 1

## 2015-04-13 MED ORDER — INSULIN ASPART 100 UNIT/ML ~~LOC~~ SOLN: 0.0000 [IU] | Freq: Every day | SUBCUTANEOUS | Status: DC

## 2015-04-13 NOTE — Progress Notes (Signed)
VASCULAR LAB PRELIMINARY  PRELIMINARY  PRELIMINARY  PRELIMINARY   Carotid duplex has been completed.    Bilateral:  1-39% ICA stenosis.  Vertebral artery flow is antegrade.   Dalbert Stillings, RVT, RDMS 04/13/2015, 11:18 AM

## 2015-04-13 NOTE — Progress Notes (Signed)
*  PRELIMINARY RESULTS* Echocardiogram 2D Echocardiogram has been performed.  Amanda Conway 04/13/2015, 3:24 PM

## 2015-04-13 NOTE — Evaluation (Signed)
Physical Therapy Evaluation Patient Details Name: Amanda Conway MRN: [id number] DOB: 07-26-1940 Today's Date: 04/13/2015   History of Present Illness  Pt adm with rt sided numbness and found to have small lt thalamic infarct. PMH - HTN, DM  Clinical Impression  Pt admitted with above diagnosis and presents to PT with functional limitations due to deficits listed below (See PT problem list). Pt needs skilled PT to maximize independence and safety to allow discharge to home. Pt with some baseline balance issues. Offered pt the option of outpatient PT to address this. Pt currently plans to enroll in silver sneakers program. Pt may eventually require assistive device and feel rollator will be most appropriate. Pt aware of this. Will follow acutely.     Follow Up Recommendations Other (comment) (Pt to register with [employer].)    Equipment Recommendations  None recommended by PT    Recommendations for Other Services       Precautions / Restrictions Precautions Precautions: None      Mobility  Bed Mobility               General bed mobility comments: Pt up in room  Transfers Overall transfer level: Modified independent                  Ambulation/Gait Ambulation/Gait assistance: Supervision;Modified independent (Device/Increase time) Ambulation Distance (Feet): 150 Feet Assistive device: None;Straight cane;4-wheeled walker;Rolling walker (2 wheeled) Gait Pattern/deviations: Step-through pattern;Decreased step length - right;Decreased step length - left Gait velocity: decr Gait velocity interpretation: Below normal speed for age/gender General Gait Details: In small environment and pt touching furniture. Pt tried cane, rollator and rolling walker. Rollator improved speed.  Stairs Stairs: Yes Stairs assistance: Min guard Stair Management: One rail Right;Step to pattern;Forwards Number of Stairs: 4 General stair comments: 1 step x 4 with sink counter  acting as rail. Verbal cues for sequence  Wheelchair Mobility    Modified Rankin (Stroke Patients Only)       Balance Overall balance assessment: Needs assistance         Standing balance support: No upper extremity supported;During functional activity Standing balance-Leahy Scale: Good                               Pertinent Vitals/Pain Pain Assessment: No/denies pain    Home Living Family/patient expects to be discharged to:: Private residence Living Arrangements: Children (younger daughter) Available Help at Discharge: Family;Available PRN/intermittently Type of Home: Apartment Home Access: Stairs to enter Entrance Stairs-Rails: Right Entrance Stairs-Number of Steps: 1 flight Home Layout: One level Home Equipment: None      Prior Function Level of Independence: Independent         Comments: History of falls but none recently.     Hand Dominance        Extremity/Trunk Assessment   Upper Extremity Assessment: Defer to OT evaluation           Lower Extremity Assessment: RLE deficits/detail         Communication   Communication: No difficulties  Cognition Arousal/Alertness: Awake/alert Behavior During Therapy: WFL for tasks assessed/performed Overall Cognitive Status: Within Functional Limits for tasks assessed                      General Comments      Exercises        Assessment/Plan    PT Assessment Patient needs continued PT services  PT Diagnosis Difficulty walking   PT Problem List Decreased balance;Decreased mobility  PT Treatment Interventions DME instruction;Gait training;Functional mobility training;Therapeutic activities;Balance training;Therapeutic exercise;Patient/family education   PT Goals (Current goals can be found in the Care Plan section) Acute Rehab PT Goals Patient Stated Goal: return home PT Goal Formulation: With patient Time For Goal Achievement: 04/20/15 Potential to Achieve Goals:  Good    Frequency Min 3X/week   Barriers to discharge        Co-evaluation               End of Session   Activity Tolerance: Patient tolerated treatment well Patient left: Other (comment) (standing at sink)           Time: [id number] PT Time Calculation (min) (ACUTE ONLY): 22 min   Charges:   PT Evaluation $PT Eval Moderate Complexity: 1 Procedure     PT G Codes:        Keah Lamba 04-23-15, 2:15 PM [facility] [place] PT [phone]

## 2015-04-13 NOTE — Progress Notes (Signed)
STROKE TEAM PROGRESS NOTE   HISTORY OF PRESENT ILLNESS Amanda Conway is a 75 y.o. female who awoke this morning with right-sided numbness. She last was normal when she went to bed 2/18, time unknown (LKW). She awoke with right-sided numbness of her face arm and leg. This was persistent and therefore she sought care in the [facility] emergency department where an MRI was obtained which shows a small thalamic infarct. She denies double vision, weakness, irregular heartbeats, nausea, vomiting, other symptoms. She does report a history of heart racing, feeling like it is her thyroid. Patient was not administered TPA secondary to delay in arrival. She was admitted for further evaluation and treatment.   SUBJECTIVE (INTERVAL HISTORY) Her family is at the bedside.  Overall she feels her condition is stable.    OBJECTIVE Temp:  [97.4 F (36.3 C)-99 F (37.2 C)] 98.2 F (36.8 C) (02/20 1021) Pulse Rate:  [68-89] 71 (02/20 1021) Cardiac Rhythm:  [-] Normal sinus rhythm;Heart block (02/20 0723) Resp:  [12-20] 20 (02/20 1021) BP: (125-179)/(53-92) 125/53 mmHg (02/20 1021) SpO2:  [95 %-100 %] 99 % (02/20 1021) Weight:  [66.86 kg (147 lb 6.4 oz)-67.586 kg (149 lb)] 66.86 kg (147 lb 6.4 oz) (02/20 0040)  CBC:   Recent Labs Lab 04/12/15 1800  WBC 6.8  NEUTROABS 3.4  HGB 14.2  HCT 41.7  MCV 96.1  PLT 0000000    Basic Metabolic Panel:   Recent Labs Lab 04/12/15 1800  NA 142  K 3.8  CL 104  CO2 27  GLUCOSE 181*  BUN 11  CREATININE 0.58  CALCIUM 9.6    Lipid Panel:     Component Value Date/Time   CHOL 259* 03/19/2015 1143   TRIG 91.0 03/19/2015 1143   HDL 53.00 03/19/2015 1143   CHOLHDL 5 03/19/2015 1143   VLDL 18.2 03/19/2015 1143   LDLCALC 187* 03/19/2015 1143   HgbA1c:  Lab Results  Component Value Date   HGBA1C 9.5* 03/19/2015   Urine Drug Screen: No results found for: LABOPIA, COCAINSCRNUR, LABBENZ, AMPHETMU, THCU, LABBARB    IMAGING  Mr Jodene Nam Head Wo  Contrast  04/12/2015  CLINICAL DATA:  Awoke last night with tingling in the right side of the body. Symptoms are persistent today. EXAM: MRI HEAD WITHOUT CONTRAST MRA HEAD WITHOUT CONTRAST TECHNIQUE: Multiplanar, multiecho pulse sequences of the brain and surrounding structures were obtained without intravenous contrast. Angiographic images of the head were obtained using MRA technique without contrast. COMPARISON:  02/28/2011 FINDINGS: MRI HEAD FINDINGS There is a 6 mm acute infarction in the left lateral thalamus. No other acute infarction. The brainstem is normal. No cerebellar insult. Cerebral hemispheres show mild to moderate chronic small-vessel ischemic changes of the deep and subcortical white matter. No large vessel territory infarction. No mass lesion, hemorrhage, hydrocephalus or extra-axial collection. No pituitary mass. No significant sinus disease. MRA HEAD FINDINGS Both internal carotid arteries are widely patent through the skullbase and siphon regions. 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 Supra clinoid internal carotid arteries are normal. The anterior and middle cerebral arteries are widely patent. Distal vessels do show atherosclerotic irregularity. Both vertebral arteries are widely patent to the basilar. No vertebral or basilar stenosis. Posterior circulation branch vessels are patent but do show some atherosclerotic irregularity. No flow limiting stenosis. IMPRESSION: 6 mm acute infarction left lateral thalamus. Moderate chronic small vessel ischemic changes elsewhere. No large or medium  vessel occlusion or correctable proximal stenosis. Atherosclerotic irregularity of the more distal branch vessels diffusely. 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 Electronically Signed   By: Nelson Chimes M.D.   On: 04/12/2015  18:46   Mr Brain Wo Contrast  04/12/2015  CLINICAL DATA:  Awoke last night with tingling in the right side of the body. Symptoms are persistent today. EXAM: MRI HEAD WITHOUT CONTRAST MRA HEAD WITHOUT CONTRAST TECHNIQUE: Multiplanar, multiecho pulse sequences of the brain and surrounding structures were obtained without intravenous contrast. Angiographic images of the head were obtained using MRA technique without contrast. COMPARISON:  02/28/2011 FINDINGS: MRI HEAD FINDINGS There is a 6 mm acute infarction in the left lateral thalamus. No other acute infarction. The brainstem is normal. No cerebellar insult. Cerebral hemispheres show mild to moderate chronic small-vessel ischemic changes of the deep and subcortical white matter. No large vessel territory infarction. No mass lesion, hemorrhage, hydrocephalus or extra-axial collection. No pituitary mass. No significant sinus disease. MRA HEAD FINDINGS Both internal carotid arteries are widely patent through the skullbase and siphon regions. 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 Supra clinoid internal carotid arteries are normal. The anterior and middle cerebral arteries are widely patent. Distal vessels do show atherosclerotic irregularity. Both vertebral arteries are widely patent to the basilar. No vertebral or basilar stenosis. Posterior circulation branch vessels are patent but do show some atherosclerotic irregularity. No flow limiting stenosis. IMPRESSION: 6 mm acute infarction left lateral thalamus. Moderate chronic small vessel ischemic changes elsewhere. No large or medium vessel occlusion or correctable proximal stenosis. Atherosclerotic irregularity of the more distal branch vessels diffusely. 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 Electronically Signed   By: Nelson Chimes  M.D.   On: 04/12/2015 18:46   Carotid Doppler   There is 1-39% bilateral ICA stenosis. Vertebral artery flow is antegrade.     PHYSICAL EXAM Pleasant elderly Caucasian lady not in distress. . Afebrile. Head is nontraumatic. Neck is supple without bruit.    Cardiac exam no murmur or gallop. Lungs are clear to auscultation. Distal pulses are well felt. Neurological Exam :  Awake alert oriented 3 with normal speech and language function. No aphasia or apraxia dysarthria. Extraocular movements full range without nystagmus. Pupils are equal reactive. Fundi were not visualized. Vision acuity seems adequate. Face is symmetric without weakness. Tongue is midline. Motor system exam reveals no upper or lower extremity drift. Symmetric and equal strength in all 4 extremity. Mild right right hemibody is sensory loss. Coordination is intact. Deep tendon reflexes are 2+ symmetric. Plantars are downgoing. Gait was not tested. ASSESSMENT/PLAN Amanda Conway is a 75 y.o. female with history of hypertension, hyperlipidemia, diabetes and hypothyroidism presenting with right-sided numbness arm face and leg. She did not receive IV t-PA due to delay in arrival.   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 Chronic small vessel disease  MRA  No medium or large vessel stenosis. 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 Also on omega-3 fatty acids  LDL 187, goal < 70  Continue statin at discharge  Diabetes type 2  HgbA1c 9.5 in January, goal < 7.0  Uncontrolled  Other Stroke Risk Factors  Advanced age  Former Cigarette smoker, quit smoking 44 years ago   Other Active Problems  hyperthyroidism  Hospital day # [place] for Pager information 04/13/2015 4:45 PM  I have personally examined this patient, reviewed notes, independently viewed imaging studies, participated in medical decision making and plan of care. I have made any additions or clarifications directly to the above note. Agree with note above. The patient presented with left hemisensory loss secondary to small thalamic infarct and remains at risk for neurological worsening, recurrent stroke, TIA needs aggressive risk factor modification and ongoing stroke evaluation. I had long discussion with the patient and family at the bedside and answered questions.  Antony Contras, MD Medical Director [place] Pager: [phone] 04/13/2015 7:27 PM    To contact Stroke Continuity provider, please refer to [email or web address] After hours, contact General Neurology

## 2015-04-13 NOTE — Consult Note (Signed)
Neurology Consultation Reason for Consult: Right-sided numbness Referring Physician: Tamala Julian, R  CC: Right-sided numbness  History is obtained from: Patient, family  HPI: Amanda Conway is a 75 y.o. female who awoke this morning with right-sided numbness. She last was normal when she went to bed 2/18. She awoke with right-sided numbness of her face arm and leg. This was persistent and therefore she sought care in the [facility] emergency department where an MRI was obtained which shows a small thalamic infarct. She denies double vision, weakness, irregular heartbeats, nausea, vomiting, other symptoms. She does report a history of heart racing, feeling like it is her thyroid.  LKW: 2/18 tpa given?: no, outside of window   ROS: A 14 point ROS was performed and is negative except as noted in the HPI.   Past Medical History  Diagnosis Date  . Diabetes mellitus   . Hypertension   . High cholesterol   . Hyperthyroidism      Family History  Problem Relation Age of Onset  . Diabetes Mother   . Cancer Father      Social History:  reports that she quit smoking about 44 years ago. Her smoking use included Cigarettes. She does not have any smokeless tobacco history on file. She reports that she does not drink alcohol or use illicit drugs.  She does occasionally smoke cigarettes, I encouraged her to quit.    Exam: Current vital signs: BP 161/82 mmHg  Pulse 86  Temp(Src) 99 F (37.2 C) (Oral)  Resp 18  SpO2 99% Vital signs in last 24 hours: Temp:  [98.2 F (36.8 C)-99 F (37.2 C)] 99 F (37.2 C) (02/20 0040) Pulse Rate:  [68-90] 86 (02/20 0040) Resp:  [12-20] 18 (02/20 0040) BP: (126-179)/(66-92) 161/82 mmHg (02/20 0040) SpO2:  [95 %-100 %] 99 % (02/20 0040) Weight:  [67.586 kg (149 lb)] 67.586 kg (149 lb) (02/19 1800)   Physical Exam  Constitutional: Appears well-developed and well-nourished.  Psych: Affect appropriate to situation Eyes: No scleral injection HENT: No  OP obstrucion Head: Normocephalic.  Cardiovascular: Normal rate and regular rhythm.  Respiratory: Effort normal and breath sounds normal to anterior ascultation GI: Soft.  No distension. There is no tenderness.  Skin: WDI  Neuro: Mental Status: Patient is awake, alert, oriented to person, place, month, year, and situation. Patient is able to give a clear and coherent history. No signs of aphasia or neglect Cranial Nerves: II: Visual Fields are full. Pupils are equal, round, and reactive to light.   III,IV, VI: EOMI without ptosis or diploplia.  V: Facial sensation is diminished on the right VII: She has mildly decreased smile on the left. VIII: hearing is intact to voice X: Uvula elevates symmetrically XI: Shoulder shrug is symmetric. XII: tongue is midline without atrophy or fasciculations.  Motor: Tone is normal. Bulk is normal. 5/5 strength was present in all four extremities.  Sensory: Sensation is diminished throughout the right side Cerebellar: FNF intact bilaterally   I have reviewed labs in epic and the results pertinent to this consultation are: CMP-unremarkable  I have reviewed the images obtained: MRI brain-small thalamic infarct on the left  Impression: 75 year old female with small thalamic infarct. This is likely due to small vessel disease. She is receiving a stroke workup.  Recommendations: 1. HgbA1c, fasting lipid panel 2. Frequent neuro checks 3. Echocardiogram 4. Carotid dopplers 5. Prophylactic therapy-Antiplatelet med: Aspirin - dose 325mg  PO or 300mg  PR 6. Risk factor modification 7. Telemetry monitoring 8. PT consult,  OT consult, Speech consult 9. please page stroke NP  Or  PA  Or MD  M-F from 8am -4 pm starting 2/20 as this patient will be followed by the stroke team at this point.   You can look them up on [email or web address]  Password [email or web address]    Roland Rack, MD [facility] [phone]  If 7pm- 7am, please page neurology on  call as listed in [place].

## 2015-04-13 NOTE — Progress Notes (Signed)
UR COMPLETED  

## 2015-04-13 NOTE — Progress Notes (Signed)
NURSING PROGRESS NOTE  Amanda Conway [id number] Admission Data: 04/13/2015 1:27 AM Attending Provider: No att. providers found OS:[id number] ALLEN, MD Code Status: full   Amanda Conway is a 75 y.o. female patient admitted from  [place] ED:  -No acute distress noted.  -No complaints of shortness of breath.  -No complaints of chest pain.   Cardiac Monitoring: Box # 25 in place. Cardiac monitor yields:normal sinus rhythm.  Blood pressure 161/82, pulse 86, temperature 99 F (37.2 C), temperature source Oral, resp. rate 18, SpO2 99 %.   IV Fluids:  IV in place, occlusive dsg intact without redness, IV cath antecubital right, condition patent and no redness none.   Allergies:  Banana; Corn-containing products; Fish allergy; and Sulfonamide derivatives  Past Medical History:   has a past medical history of Diabetes mellitus; Hypertension; High cholesterol; and Hyperthyroidism.  Past Surgical History:   has past surgical history that includes bilateral rotator cuff repair.  Social History:   reports that she quit smoking about 44 years ago. Her smoking use included Cigarettes. She does not have any smokeless tobacco history on file. She reports that she does not drink alcohol or use illicit drugs.  Skin: Intact  Patient/Family orientated to room. Information packet given to patient/family. Admission inpatient armband information verified with patient/family to include name and date of birth and placed on patient arm. Side rails up x 2, and education completed with patient/family. Patient/family able to verbalize understanding of risk associated with falls and verbalized understanding to call for assistance before getting out of bed. Call light within reach. Patient/family able to voice and demonstrate understanding of unit orientation instructions.

## 2015-04-13 NOTE — Progress Notes (Signed)
[facility] PROGRESS NOTE    Progress Note   Amanda Conway [id number] DOB: 1940/05/02 DOA: 04/12/2015 PCP: Amanda Man, MD   Brief Narrative:   Amanda Conway is an 75 y.o. female past medical history of hypertension and hyperlipidemia, diabetes mellitus presents to the ED with right-sided numbness that started in the morning.  Assessment/Plan:  CVA (cerebral infarction) HgbA1c pending, fasting lipid panel pedning cont statins MRI, MRA of the brain without contrast showed acute left lateral thalamus. PT, OT, consult are pending. Echocardiogram and Carotid dopplers are pending Prophylactic therapy-Antiplatelet med: Aspirin - dose 325 mg PO daily  Risk factor modification  Cardiac Monitoring  Neurochecks q4h   Hyperlipidemia: Cont statins.  Essential hypertension DC ACE inhibitor allow permissive hypertension.  Type 2 diabetes mellitus (HCC) Cerebral sliding scale insulin. A1c is pending.  History of hyperthyroidism Cont synthroid    DVT Prophylaxis - Lovenox ordered.  Family Communication: none Disposition Plan: Home 1-2 days Code Status:     Code Status Orders        Start     Ordered   04/12/15 2032  Full code   Continuous     04/12/15 2035    Code Status History    Date Active Date Inactive Code Status Order ID Comments User Context   This patient has a current code status but no historical code status.        IV Access:    Peripheral IV   Procedures and diagnostic studies:   Mr Amanda Conway Wo Contrast  04/12/2015  CLINICAL DATA:  Awoke last night with tingling in the right side of the body. Symptoms are persistent today. EXAM: MRI HEAD WITHOUT CONTRAST MRA HEAD WITHOUT CONTRAST TECHNIQUE: Multiplanar, multiecho pulse sequences of the brain and surrounding structures were obtained without intravenous contrast. Angiographic images of the head were obtained using MRA technique without contrast. COMPARISON:  02/28/2011 FINDINGS:  MRI HEAD FINDINGS There is a 6 mm acute infarction in the left lateral thalamus. No other acute infarction. The brainstem is normal. No cerebellar insult. Cerebral hemispheres show mild to moderate chronic small-vessel ischemic changes of the deep and subcortical white matter. No large vessel territory infarction. No mass lesion, hemorrhage, hydrocephalus or extra-axial collection. No pituitary mass. No significant sinus disease. MRA HEAD FINDINGS Both internal carotid arteries are widely patent through the skullbase and siphon regions. 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 Supra clinoid internal carotid arteries are normal. The anterior and middle cerebral arteries are widely patent. Distal vessels do show atherosclerotic irregularity. Both vertebral arteries are widely patent to the basilar. No vertebral or basilar stenosis. Posterior circulation branch vessels are patent but do show some atherosclerotic irregularity. No flow limiting stenosis. IMPRESSION: 6 mm acute infarction left lateral thalamus. Moderate chronic small vessel ischemic changes elsewhere. No large or medium vessel occlusion or correctable proximal stenosis. Atherosclerotic irregularity of the more distal branch vessels diffusely. 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 Electronically Signed   By: Amanda Conway M.D.   On: 04/12/2015 18:46   Mr Brain Wo Contrast  04/12/2015  CLINICAL DATA:  Awoke last night with tingling in the right side of the body. Symptoms are persistent today. EXAM: MRI HEAD WITHOUT CONTRAST MRA HEAD WITHOUT CONTRAST TECHNIQUE: Multiplanar, multiecho pulse sequences of the brain and surrounding structures were obtained without intravenous contrast. Angiographic images of  the head were obtained using MRA technique without contrast. COMPARISON:   02/28/2011 FINDINGS: MRI HEAD FINDINGS There is a 6 mm acute infarction in the left lateral thalamus. No other acute infarction. The brainstem is normal. No cerebellar insult. Cerebral hemispheres show mild to moderate chronic small-vessel ischemic changes of the deep and subcortical white matter. No large vessel territory infarction. No mass lesion, hemorrhage, hydrocephalus or extra-axial collection. No pituitary mass. No significant sinus disease. MRA HEAD FINDINGS Both internal carotid arteries are widely patent through the skullbase and siphon regions. 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 Supra clinoid internal carotid arteries are normal. The anterior and middle cerebral arteries are widely patent. Distal vessels do show atherosclerotic irregularity. Both vertebral arteries are widely patent to the basilar. No vertebral or basilar stenosis. Posterior circulation branch vessels are patent but do show some atherosclerotic irregularity. No flow limiting stenosis. IMPRESSION: 6 mm acute infarction left lateral thalamus. Moderate chronic small vessel ischemic changes elsewhere. No large or medium vessel occlusion or correctable proximal stenosis. Atherosclerotic irregularity of the more distal branch vessels diffusely. 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 Electronically Signed   By: Amanda Conway M.D.   On: 04/12/2015 18:46     Medical Consultants:    None.  Anti-Infectives:   Anti-infectives    None      Subjective:    Amanda Conway no complaints she will she has no deficits.  Objective:    Filed Vitals:   04/12/15 2300 04/13/15 0040 04/13/15 0300 04/13/15 0502  BP: 149/77 161/82 138/69 126/59  Pulse: 68 86 76 70  Temp:  99 F (37.2 C) 97.8 F (36.6 C) 97.4 F (36.3 C)  TempSrc:  Oral Oral Oral    Resp: 12 18 16 18   Height:  5' (1.524 m)    Weight:  66.86 kg (147 lb 6.4 oz)    SpO2: 100% 99% 99% 100%    Intake/Output Summary (Last 24 hours) at 04/13/15 [id number] Last data filed at 04/13/15 0200  Gross per 24 hour  Intake    150 ml  Output      0 ml  Net    150 ml   Filed Weights   04/13/15 0040  Weight: 66.86 kg (147 lb 6.4 oz)    Exam: Gen:  NAD Cardiovascular:  RRR, No M/R/G Chest and lungs:   CTAB Abdomen:  Abdomen soft, NT/ND, + BS Extremities:  No C/E/C Neurological exam: Awake alert and oriented 3 completely nonfocal, except for diminished sensation on the right upper extremity.   Data Reviewed:    Labs: Basic Metabolic Panel:  Recent Labs Lab 04/12/15 1800  NA 142  K 3.8  CL 104  CO2 27  GLUCOSE 181*  BUN 11  CREATININE 0.58  CALCIUM 9.6   GFR Estimated Creatinine Clearance: 52.7 mL/min (by C-G formula based on Cr of 0.58). Liver Function Tests:  Recent Labs Lab 04/12/15 1800  AST 15  ALT 13*  ALKPHOS 106  BILITOT 0.6  PROT 7.1  ALBUMIN 4.1   No results for input(s): LIPASE, AMYLASE in the last 168 hours. No results for input(s): AMMONIA in the last 168 hours. Coagulation profile No results for input(s): INR, PROTIME in the last 168 hours.  CBC:  Recent Labs Lab 04/12/15 1800  WBC 6.8  NEUTROABS 3.4  HGB 14.2  HCT 41.7  MCV 96.1  PLT 392   Cardiac Enzymes:  Recent Labs Lab 04/12/15 1800  TROPONINI <0.03   BNP (last 3 results) No results for input(s): PROBNP in the last 8760 hours. CBG:  Recent Labs Lab 04/13/15 0047  GLUCAP 175*   D-Dimer: No results for input(s): DDIMER in the last 72 hours. Hgb A1c: No results for input(s): HGBA1C in the last 72 hours. Lipid Profile: No results for input(s): CHOL, HDL, LDLCALC, TRIG, CHOLHDL, LDLDIRECT in the last 72 hours. Thyroid function studies: No results for input(s): TSH, T4TOTAL, T3FREE, THYROIDAB in the last 72 hours.  Invalid input(s): FREET3 Anemia work  up: No results for input(s): VITAMINB12, FOLATE, FERRITIN, TIBC, IRON, RETICCTPCT in the last 72 hours. Sepsis Labs:  Recent Labs Lab 04/12/15 1800  WBC 6.8   Microbiology No results found for this or any previous visit (from the past 240 hour(s)).   Medications:   . amLODipine  5 mg Oral Daily  . aspirin EC  325 mg Oral Daily  . benazepril  20 mg Oral Daily  . enoxaparin (LOVENOX) injection  40 mg Subcutaneous QHS  . insulin aspart  0-9 Units Subcutaneous TID WC  . multivitamin with minerals  1 tablet Oral Daily  . omega-3 acid ethyl esters  1 g Oral Once per day on Mon Wed Fri  . pravastatin  10 mg Oral q1800   Continuous Infusions:   Time spent: 25 min   LOS: 1 day   Charlynne Cousins  [facility] Pager [phone]  *Please refer to [email or web address], password [email or web address] to get updated schedule on who will round on this patient, as hospitalists switch teams weekly. If 7PM-7AM, please contact night-coverage at [email or web address], password [email or web address] for any overnight needs.  04/13/2015, 8:12 AM

## 2015-04-14 LAB — GLUCOSE, CAPILLARY
GLUCOSE-CAPILLARY: 175 mg/dL — AB (ref 65–99)
Glucose-Capillary: 157 mg/dL — ABNORMAL HIGH (ref 65–99)

## 2015-04-14 MED ORDER — GLIPIZIDE 5 MG PO TABS: 5.0000 mg | ORAL_TABLET | Freq: Two times a day (BID) | ORAL | Status: DC

## 2015-04-14 MED ORDER — METFORMIN HCL 500 MG PO TABS: 500.0000 mg | ORAL_TABLET | Freq: Two times a day (BID) | ORAL | Status: DC

## 2015-04-14 MED ORDER — ASPIRIN 325 MG PO TBEC: 325.0000 mg | DELAYED_RELEASE_TABLET | Freq: Every day | ORAL | Status: AC

## 2015-04-14 NOTE — Progress Notes (Signed)
NURSING PROGRESS NOTE  [facility] [id number] Discharge Data: 04/14/2015 5:32 PM Attending Provider: Charlynne Cousins, MD [id number] Amanda Resides, MD     Vance Peper to be D/C'd Home per MD order.  Discussed with the patient and daughter Santiago Glad the After Visit Summary and all questions fully answered. Pt explained new medications and prescriptions were given. Pt understands the importance of following her medication regimen. All IV's discontinued with no bleeding noted. All belongings returned to patient for patient to take home. telebox was cleaned and placed up from. ccmd was notified.Pt taken to car via wheelchair by nursing staff.  Last Vital Signs:  Blood pressure 133/78, pulse 80, temperature 99 F (37.2 C), temperature source Oral, resp. rate 20, height 5' (1.524 m), weight 66.86 kg (147 lb 6.4 oz), SpO2 99 %.  Discharge Medication List   Medication List    STOP taking these medications        aspirin 81 MG tablet  Replaced by:  aspirin 325 MG EC tablet      TAKE these medications        ACCU-CHEK FASTCLIX LANCETS Misc  Dx E11.9 test once daily     amLODipine 5 MG tablet  Commonly known as:  NORVASC  Take 1 tablet (5 mg total) by mouth daily.     aspirin 325 MG EC tablet  Take 1 tablet (325 mg total) by mouth daily.     benazepril 20 MG tablet  Commonly known as:  LOTENSIN  Take 1 tablet (20 mg total) by mouth daily.     EPINEPHrine 0.3 mg/0.3 mL Devi  Commonly known as:  EPI-PEN  Inject 0.3 mLs (0.3 mg total) into the muscle once.     Fish Oil 1200 MG Cpdr  Take 1 capsule by mouth 3 (three) times a week.     glipiZIDE 10 MG tablet  Commonly known as:  GLUCOTROL  TAKE 1 TABLET BY MOUTH TWICE DAILY BEFORE A MEAL     glucose blood test strip  Commonly known as:  ACCU-CHEK SMARTVIEW  E11.9, test once daily     JOINT FORMULA PO  Take by mouth. Joint juice     metFORMIN 500 MG tablet  Commonly known as:  GLUCOPHAGE  Take 1 tablet (500 mg total) by  mouth 2 (two) times daily with a meal.     MULTIVITAL PO  Take by mouth.     pravastatin 10 MG tablet  Commonly known as:  PRAVACHOL  1 tablet daily

## 2015-04-14 NOTE — Progress Notes (Signed)
[facility] Center to call pt to arrange appointment within next 2 days, CM made pt aware. Whitman Hero RN,BSN,CM

## 2015-04-14 NOTE — Evaluation (Signed)
Occupational Therapy Evaluation Patient Details Name: Amanda Conway MRN: [id number] DOB: 10/06/1940 Today's Date: 04/14/2015    History of Present Illness Pt adm with rt sided numbness and found to have small lt thalamic infarct. PMH - HTN, DM   Clinical Impression   Pt currently modified independent level for toileting with supervision for simulated shower/tub transfers.  Recommend a shower seat for safety as pt does exhibit slower rate of speed with mobility and also LOB with higher level tasks.  Slight RUE numbness and decreased light touch acuity noted with decreased gross coordination and decreased hand strength.  Issued FM and therapy putty exercises to be performed daily.  No further acute or post acute OT needs.  Recommend supervision for shower transfers until further outpatient PT is completed to improve her dynamic balance.      Follow Up Recommendations  No OT follow up;Supervision - Intermittent    Equipment Recommendations   (recommend shower seat, she will purchase outside of the hospital)       Precautions / Restrictions Precautions Precautions: None Restrictions Weight Bearing Restrictions: No      Mobility Bed Mobility               General bed mobility comments: pt up in chair  Transfers Overall transfer level: Modified independent Equipment used: None             General transfer comment: Pt moving very slow compared to normal rate of speed with transfers and mobility.    Balance Overall balance assessment: Needs assistance Sitting-balance support: No upper extremity supported         Standing balance-Leahy Scale: Fair Standing balance comment: Pt with 2 LOB with mobiity when stepping over an object and when walking with head turns.  She was able to regain her balance without the need for physical assist.                              ADL Overall ADL's : Needs assistance/impaired Eating/Feeding: Independent;Sitting    Grooming: Wash/dry hands;Standing;Modified independent   Upper Body Bathing: Set up;Sitting   Lower Body Bathing: Set up;Sit to/from stand Lower Body Bathing Details (indicate cue type and reason): simulated Upper Body Dressing : Set up;Sitting Upper Body Dressing Details (indicate cue type and reason): simulated Lower Body Dressing: Set up;Sit to/from stand   Toilet Transfer: Modified Independent;Ambulation;Regular Toilet;Grab bars   Toileting- Clothing Manipulation and Hygiene: Modified independent;Sit to/from stand Toileting - Clothing Manipulation Details (indicate cue type and reason): simulated     Functional mobility during ADLs: Supervision/safety General ADL Comments: Pt with higher level balance issues noted during mobility with head turns.  Pt with LOB to the right with head extension as well as with head turn to the right.  She was able to regain balance without physical assist.  Issued FM coordination handout and theraputty handout with red medium resistance theraputty to increase right hand strength and coordination.  Also instructed pt to use shower seat for safety as well until futher work on balance is completed with outpatient PT.       Vision Vision Assessment?: No apparent visual deficits   Perception Perception Perception Tested?: Yes   Praxis Praxis Praxis tested?: Within functional limits    Pertinent Vitals/Pain Pain Assessment: No/denies pain     Hand Dominance Right   Extremity/Trunk Assessment Upper Extremity Assessment Upper Extremity Assessment: RUE deficits/detail RUE Deficits / Details: Grip strength as  well as shoulder flexion strength 3+/5.  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  Elbow AROM WFLs with strength 4/5.   RUE Sensation: decreased light touch RUE Coordination: decreased fine motor (slower coordination with finger to nose testing compared the the non-involved LUE.  )    Lower Extremity Assessment Lower Extremity Assessment: Defer to PT evaluation   Cervical / Trunk Assessment Cervical / Trunk Assessment: Normal   Communication Communication Communication: No difficulties   Cognition Arousal/Alertness: Awake/alert Behavior During Therapy: WFL for tasks assessed/performed Overall Cognitive Status: No family/caregiver present to determine baseline cognitive functioning       Memory: Decreased short-term memory (Pt only able to recall 1/3 words after 1 minute delay and 0/3 after 5 minute delay.  Pt does not report having memory issues that affected her day to day prior to this.  )                        Home Living Family/patient expects to be discharged to:: Private residence Living Arrangements: Children (younger daughter) Available Help at Discharge: Family;Available PRN/intermittently Type of Home: Apartment Home Access: Stairs to enter Entrance Stairs-Number of Steps: 1 flight Entrance Stairs-Rails: Right Home Layout: One level     Bathroom Shower/Tub: Teacher, early years/pre: Standard     Home Equipment: None          Prior Functioning/Environment Level of Independence: Independent        Comments: History of falls but none recently.    OT Diagnosis: Generalized weakness                          End of Session Equipment Utilized During Treatment: Gait belt  Activity Tolerance: Patient tolerated treatment well Patient left: in chair;with call bell/phone within reach   Time: 1137-1215 OT Time Calculation (min): 38 min Charges:  OT General Charges $OT Visit: 1 Procedure OT Evaluation $OT Eval Moderate Complexity: 1 Procedure OT Treatments $Self Care/Home Management : 8-22 mins $Therapeutic Exercise: 8-22 mins  Benoit Meech OTR/L 04/14/2015, 12:53 PM

## 2015-04-14 NOTE — Progress Notes (Signed)
SLP Cancellation Note  Patient Details Name: Amanda Conway MRN: [id number] DOB: 28-Feb-1940   Cancelled treatment:       Reason Eval/Treat Not Completed: SLP screened, no needs identified, will sign off.  Patient reports that she is at her baseline level of functioning with regards to her memory and utilizing compensatory strategies as needed.  Educated patient on outpatient services being available if needed.  Gunnar Fusi, M.A., CCC-SLP [phone]  [place] 04/14/2015, 3:53 PM

## 2015-04-14 NOTE — Progress Notes (Signed)
Spoke with provider about pt stating she doesn't take metFORMIN at home because it upsets her stomach. Per provider, he recommends pt take medication and stop if upsets her stomach. Will inform pt of the conversation.

## 2015-04-14 NOTE — Progress Notes (Addendum)
STROKE TEAM PROGRESS NOTE   HISTORY OF PRESENT ILLNESS Amanda Conway is a 75 y.o. female who awoke this morning with right-sided numbness. She last was normal when she went to bed 2/18, time unknown (LKW). She awoke with right-sided numbness of her face arm and leg. This was persistent and therefore she sought care in the [facility] emergency department where an MRI was obtained which shows a small thalamic infarct. She denies double vision, weakness, irregular heartbeats, nausea, vomiting, other symptoms. She does report a history of heart racing, feeling like it is her thyroid. Patient was not administered TPA secondary to delay in arrival. She was admitted for further evaluation and treatment.   SUBJECTIVE (INTERVAL HISTORY) Her family is at the bedside.  Overall she feels her condition is stable. She is ready for discharge   OBJECTIVE Temp:  [98.2 F (36.8 C)-99 F (37.2 C)] 99 F (37.2 C) (02/21 1344) Pulse Rate:  [61-80] 80 (02/21 1344) Cardiac Rhythm:  [-] Sinus bradycardia (02/21 0700) Resp:  [18-20] 20 (02/21 1344) BP: (105-146)/(53-78) 133/78 mmHg (02/21 1344) SpO2:  [96 %-99 %] 99 % (02/21 1344)  CBC:   Recent Labs Lab 04/12/15 1800  WBC 6.8  NEUTROABS 3.4  HGB 14.2  HCT 41.7  MCV 96.1  PLT 0000000    Basic Metabolic Panel:   Recent Labs Lab 04/12/15 1800  NA 142  K 3.8  CL 104  CO2 27  GLUCOSE 181*  BUN 11  CREATININE 0.58  CALCIUM 9.6    Lipid Panel:     Component Value Date/Time   CHOL 259* 03/19/2015 1143   TRIG 91.0 03/19/2015 1143   HDL 53.00 03/19/2015 1143   CHOLHDL 5 03/19/2015 1143   VLDL 18.2 03/19/2015 1143   LDLCALC 187* 03/19/2015 1143   HgbA1c:  Lab Results  Component Value Date   HGBA1C 9.5* 03/19/2015   Urine Drug Screen: No results found for: LABOPIA, COCAINSCRNUR, LABBENZ, AMPHETMU, THCU, LABBARB    IMAGING  No results found. Carotid Doppler   There is 1-39% bilateral ICA stenosis. Vertebral artery flow is antegrade.      PHYSICAL EXAM Pleasant elderly Caucasian lady not in distress. . Afebrile. Head is nontraumatic. Neck is supple without bruit.    Cardiac exam no murmur or gallop. Lungs are clear to auscultation. Distal pulses are well felt. Neurological Exam :  Awake alert oriented 3 with normal speech and language function. No aphasia or apraxia dysarthria. Extraocular movements full range without nystagmus. Pupils are equal reactive. Fundi were not visualized. Vision acuity seems adequate. Face is symmetric without weakness. Tongue is midline. Motor system exam reveals no upper or lower extremity drift. Symmetric and equal strength in all 4 extremity. Mild right right hemibody is sensory loss. Coordination is intact. Deep tendon reflexes are 2+ symmetric. Plantars are downgoing. Gait was not tested. ASSESSMENT/PLAN Amanda Conway is a 75 y.o. female with history of hypertension, hyperlipidemia, diabetes and hypothyroidism presenting with right-sided numbness arm face and leg. She did not receive IV t-PA due to delay in arrival.   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 Chronic small vessel disease  MRA  No medium or large vessel stenosis. Incidental left carotid siphon aneurysm  Carotid Doppler  No source of embolus   2D Echo  Left ventricle: The cavity size was normal. Systolic function was normal. The estimated ejection fraction was in the range of 60% to 65%. Wall  motion was normal; there were no regional wall motion abnormalities.  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 Also on omega-3 fatty acids  LDL 187, goal < 70  Continue statin at discharge  Diabetes type 2  HgbA1c 9.5 in January, goal < 7.0  Uncontrolled  Other Stroke Risk Factors  Advanced age  Former Cigarette smoker, quit smoking 44 years ago   Other Active Problems  hyperthyroidism  Hospital day # [place] for Pager information 04/14/2015 6:44 PM  I have personally examined this patient, reviewed notes, independently viewed imaging studies, participated in medical decision making and plan of care. I have made any additions or clarifications directly to the above note. Agree with note above. The patient presented with left hemisensory loss secondary to small thalamic infarct and remains at risk for neurological worsening, recurrent stroke, TIA needs aggressive risk factor modification and ongoing stroke evaluation. I had long discussion with the patient and family at the bedside and answered questions. Stroke team will sign off. Kindly call for questions. Follow-up in the stroke clinic in 2 months  Antony Contras, MD Medical Director [place] Pager: [phone] 04/14/2015 6:44 PM    To contact Stroke Continuity provider, please refer to [email or web address] After hours, contact General Neurology

## 2015-04-14 NOTE — Progress Notes (Signed)
PT Note  Pt has decided she isn't ready to pursue Silver Sneakers yet. Spoke to her again about outpatient PT and pt agrees that that would be best option.   [employer] PT [phone]

## 2015-04-14 NOTE — Discharge Summary (Addendum)
Physician Discharge Summary  Amanda Conway [id number] DOB: 1940/12/10 DOA: 04/12/2015  PCP: Amanda Man, MD  Admit date: 04/12/2015 Discharge date: 04/14/2015  Time spent:35  minutes  Recommendations for Outpatient Follow-up:  1. Follow-up with neurology in 2-4 weeks.   Discharge Diagnoses:  Principal Problem:   CVA (cerebral infarction) Active Problems:   Hyperlipidemia   Essential hypertension   Type 2 diabetes mellitus (HCC)   History of hyperthyroidism   Cerebrovascular accident (CVA) due to occlusion of cerebral artery [facility])   Discharge Condition: stable  Diet recommendation: carb modified  Filed Weights   04/13/15 0040  Weight: 66.86 kg (147 lb 6.4 oz)    History of present illness:   75 year old with past medical history of hypertension, diabetes mellitus poorly controlled and tobacco abuser comes in with right-sided numbness that started the night prior to admission. She also has some facial numbness that appears to have resolved when she was in the ED.   Hospital Course:  Acute CVA: Her last A1c in January was 9.5, MRI of the brain showed a new acute left lateral thalamus. PT was consulted and she requires no home health needs, carotid Doppler showed no ICA stenosis  echo be follow-up by neurologist. She will go home on aspirin 325 she had no once on telemetry. She was started on a statin.  Essential hypertension: These were held on admission, she can resume as an outpatient.  Uncontrolled diabetes mellitus type 2: Glipizide was held on admission she was 7 sliding scale insulin. She only required minimal insulin. She'll resume her glipizide at home and she was started on metformin she would take twice a day and titrate as an outpatient by her PCP.  Procedures:   MRI of the brain showed a new acute left colonic stroke.  Carotid Doppler show no ICA stenosis bilaterally.  CT of the head showed no bleeds.  Carotid Dopplers are  pending  Consultations:  Neurology  Discharge Exam: Filed Vitals:   04/14/15 1200 04/14/15 1344  BP: 127/72 133/78  Pulse: 75 80  Temp: 98.3 F (36.8 C) 99 F (37.2 C)  Resp: 18 20    General: A&O x3 Cardiovascular: RRR Respiratory: good air movement CTA B/L  Discharge Instructions   Discharge Instructions    Ambulatory referral to Physical Therapy    Complete by:  As directed      Diet - low sodium heart healthy    Complete by:  As directed      Increase activity slowly    Complete by:  As directed           Current Discharge Medication List    START taking these medications   Details  aspirin EC 325 MG EC tablet Take 1 tablet (325 mg total) by mouth daily. Qty: 30 tablet, Refills: 0    metFORMIN (GLUCOPHAGE) 500 MG tablet Take 1 tablet (500 mg total) by mouth 2 (two) times daily with a meal. Qty: 60 tablet, Refills: 3      CONTINUE these medications which have NOT CHANGED   Details  amLODipine (NORVASC) 5 MG tablet Take 1 tablet (5 mg total) by mouth daily. Qty: 90 tablet, Refills: 3    benazepril (LOTENSIN) 20 MG tablet Take 1 tablet (20 mg total) by mouth daily. Qty: 90 tablet, Refills: 3    EPINEPHrine (EPI-PEN) 0.3 mg/0.3 mL DEVI Inject 0.3 mLs (0.3 mg total) into the muscle once. Qty: 1 Device, Refills: 2   Associated Diagnoses: Urticaria due to food  allergy    glipiZIDE (GLUCOTROL) 10 MG tablet TAKE 1 TABLET BY MOUTH TWICE DAILY BEFORE A MEAL Qty: 60 tablet, Refills: 2    Multiple Vitamins-Minerals (MULTIVITAL PO) Take by mouth.    Nutritional Supplements (JOINT FORMULA PO) Take by mouth. Joint juice    Omega-3 Fatty Acids (FISH OIL) 1200 MG CPDR Take 1 capsule by mouth 3 (three) times a week.     pravastatin (PRAVACHOL) 10 MG tablet 1 tablet daily Qty: 100 tablet, Refills: 3   Associated Diagnoses: Hyperlipidemia    ACCU-CHEK FASTCLIX LANCETS MISC Dx E11.9 test once daily Qty: 100 each, Refills: 12    glucose blood (ACCU-CHEK  SMARTVIEW) test strip E11.9, test once daily Qty: 100 each, Refills: 12      STOP taking these medications     aspirin 81 MG tablet        Allergies  Allergen Reactions  . Banana     Lungs collapsed   . Corn-Containing Products     Tongue swells, pt's tolerated recently  . Fish Allergy Itching    In large amounts  . Sulfonamide Derivatives     REACTION: family hx   Follow-up Information    Follow up with AmandaJEFFREY ALLEN, MD In 2 weeks.   Specialty:  Family Medicine   Contact information:   [place] [address] [phone]       Go to [facility].   Specialty:  Rehabilitation   Why:  Outpatient PT    Contact information:   [address] [place] [id number] [place] [phone]       The results of significant diagnostics from this hospitalization (including imaging, microbiology, ancillary and laboratory) are listed below for reference.    Significant Diagnostic Studies: Mr [name] Wo Contrast  04/12/2015  CLINICAL DATA:  Awoke last night with tingling in the right side of the body. Symptoms are persistent today. EXAM: MRI HEAD WITHOUT CONTRAST MRA HEAD WITHOUT CONTRAST TECHNIQUE: Multiplanar, multiecho pulse sequences of the brain and surrounding structures were obtained without intravenous contrast. Angiographic images of the head were obtained using MRA technique without contrast. COMPARISON:  02/28/2011 FINDINGS: MRI HEAD FINDINGS There is a 6 mm acute infarction in the left lateral thalamus. No other acute infarction. The brainstem is normal. No cerebellar insult. Cerebral hemispheres show mild to moderate chronic small-vessel ischemic changes of the deep and subcortical white matter. No large vessel territory infarction. No mass lesion, hemorrhage, hydrocephalus or extra-axial collection. No pituitary mass. No significant sinus disease. MRA HEAD FINDINGS Both internal  carotid arteries are widely patent through the skullbase and siphon regions. 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 Supra clinoid internal carotid arteries are normal. The anterior and middle cerebral arteries are widely patent. Distal vessels do show atherosclerotic irregularity. Both vertebral arteries are widely patent to the basilar. No vertebral or basilar stenosis. Posterior circulation branch vessels are patent but do show some atherosclerotic irregularity. No flow limiting stenosis. IMPRESSION: 6 mm acute infarction left lateral thalamus. Moderate chronic small vessel ischemic changes elsewhere. No large or medium vessel occlusion or correctable proximal stenosis. Atherosclerotic irregularity of the more distal branch vessels diffusely. 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 Electronically Signed   By: Nelson Chimes M.D.   On: 04/12/2015 18:46   Mr Brain Wo Contrast  04/12/2015  CLINICAL DATA:  Awoke last night with tingling in the right side of the body. Symptoms are persistent today. EXAM: MRI HEAD WITHOUT CONTRAST MRA HEAD WITHOUT CONTRAST TECHNIQUE: Multiplanar, multiecho pulse sequences of the brain and surrounding structures were obtained without intravenous contrast. Angiographic images of the head were obtained using MRA technique without contrast. COMPARISON:  02/28/2011 FINDINGS: MRI HEAD FINDINGS There is a 6 mm acute infarction in the left lateral thalamus. No other acute infarction. The brainstem is normal. No cerebellar insult. Cerebral hemispheres show mild to moderate chronic small-vessel ischemic changes of the deep and subcortical white matter. No large vessel territory infarction. No mass lesion, hemorrhage, hydrocephalus or extra-axial collection. No pituitary mass. No significant sinus disease. MRA HEAD  FINDINGS Both internal carotid arteries are widely patent through the skullbase and siphon regions. 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 Supra clinoid internal carotid arteries are normal. The anterior and middle cerebral arteries are widely patent. Distal vessels do show atherosclerotic irregularity. Both vertebral arteries are widely patent to the basilar. No vertebral or basilar stenosis. Posterior circulation branch vessels are patent but do show some atherosclerotic irregularity. No flow limiting stenosis. IMPRESSION: 6 mm acute infarction left lateral thalamus. Moderate chronic small vessel ischemic changes elsewhere. No large or medium vessel occlusion or correctable proximal stenosis. Atherosclerotic irregularity of the more distal branch vessels diffusely. 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 Electronically Signed   By: Nelson Chimes M.D.   On: 04/12/2015 18:46    Microbiology: No results found for this or any previous visit (from the past 240 hour(s)).   Labs: Basic Metabolic Panel:  Recent Labs Lab 04/12/15 1800  NA 142  K 3.8  CL 104  CO2 27  GLUCOSE 181*  BUN 11  CREATININE 0.58  CALCIUM 9.6   Liver Function Tests:  Recent Labs Lab 04/12/15 1800  AST 15  ALT 13*  ALKPHOS 106  BILITOT 0.6  PROT 7.1  ALBUMIN 4.1   No results for input(s): LIPASE, AMYLASE in the last 168 hours. No results for input(s): AMMONIA in the last 168 hours. CBC:  Recent Labs Lab 04/12/15 1800  WBC 6.8  NEUTROABS 3.4  HGB 14.2  HCT 41.7  MCV 96.1  PLT 392   Cardiac Enzymes:  Recent Labs Lab 04/12/15 1800  TROPONINI <0.03   BNP: BNP (last 3 results) No results for input(s): BNP in the last 8760 hours.  ProBNP (last 3 results) No results for input(s): PROBNP in the last 8760  hours.  CBG:  Recent Labs Lab 04/13/15 1215 04/13/15 1717 04/13/15 2223 04/14/15 0754 04/14/15 1221  GLUCAP 200* 89 126* 175* 157*     Signed:  Charlynne Cousins MD.  [facility] 04/14/2015, 2:53 PM

## 2015-04-15 ENCOUNTER — Telehealth: Payer: Self-pay | Admitting: Family Medicine

## 2015-04-15 NOTE — Telephone Encounter (Signed)
FYI Pt is calling to let rachel know the hospital did want they was suppose to do. Pt does not need anything from [place] at this time.

## 2015-04-15 NOTE — Telephone Encounter (Signed)
Pt called back to advise she needs an order (to show proof) from the doctor in order to get a "rolling walker". The one with a seat. Pt called her insurance and they gave her a number the doctor can call to give the ok.  Provider line: [phone]  Pt would like a call back to confirm.

## 2015-04-16 NOTE — Telephone Encounter (Addendum)
Pt called back to advise that she would like to set up home health with [facility]. Pt states she just had a stroke and is in no shape to go anywhere. Pt is still numb on right.  Pt also needs hospital follow up appt in 2 weeks from discharge/ pls advise  pls call back

## 2015-04-20 ENCOUNTER — Ambulatory Visit (INDEPENDENT_AMBULATORY_CARE_PROVIDER_SITE_OTHER): Payer: Medicare Other | Admitting: Family Medicine

## 2015-04-20 ENCOUNTER — Encounter: Payer: Self-pay | Admitting: Family Medicine

## 2015-04-20 VITALS — BP 130/80 | Temp 98.4°F | Wt 147.0 lb

## 2015-04-20 DIAGNOSIS — E785 Hyperlipidemia, unspecified: Secondary | ICD-10-CM | POA: Diagnosis not present

## 2015-04-20 DIAGNOSIS — E139 Other specified diabetes mellitus without complications: Secondary | ICD-10-CM

## 2015-04-20 DIAGNOSIS — I639 Cerebral infarction, unspecified: Secondary | ICD-10-CM

## 2015-04-20 DIAGNOSIS — I10 Essential (primary) hypertension: Secondary | ICD-10-CM

## 2015-04-20 NOTE — Progress Notes (Signed)
Pre visit review using our clinic review tool, if applicable. No additional management support is needed unless otherwise documented below in the visit note. 

## 2015-04-20 NOTE — Telephone Encounter (Signed)
Left message on machine for patient.  The doctor who ordered the PT should be the one who orders in-home PT.  Order for walker ready to pick up.

## 2015-04-20 NOTE — Progress Notes (Signed)
   Subjective:    Patient ID: Amanda Conway, female    DOB: 03-09-1940, 75 y.o.   MRN: [id number]  HPI Amanda Conway is a 75 year old female ex-smoker...Marland KitchenMarland KitchenMarland Kitchen she quit since she had a stroke on February 19,,,,, who comes in today for follow-up of her hospitalization  She was noted on February 19 with right-sided numbness. MRI showed a 6 mm acute infarct left lateral thalamic area. There is no other acute findings. Major vessels were clean. She does have some small vessel disease. Her risk factors were high blood pressure diabetes uncontrolled and smoking. She also has hyperlipidemia was on Pravachol.  Since her hospitalization she smoked stop smoking completely. It was arranged for her to have outpatient therapy however she wants it done in home. We will have to write a prescription for this   Review of Systems Review of systems otherwise negative    Objective:   Physical Exam  Well-developed well-nourished female no acute distress vital signs stable she's afebrile      Assessment & Plan:  Status post right thalamic infarct,,,,,,,,, recovering slowly  Hypertension at goal,,,,, continue current therapy  Diabetes much improved,,,,,,,, off metformin,,,,,,, on Glucotrol 10 mg twice a day blood sugar in the 80-90 range no hypoglycemia  Hyperlipidemia,,,,,,,,, continue Pravachol and aspirin,

## 2015-04-20 NOTE — Patient Instructions (Signed)
Set up a time in April or May to get established with one of our new nurse practitioners her physicians for long-term care,,,,,,,,, Dr. Martinique,,,,,,,,,, our new physician,,,,,,,,,,, or Almyra Free our new adult nurse practitioner  Follow-up with your endocrinologist as outlined

## 2015-04-27 ENCOUNTER — Ambulatory Visit: Payer: Medicare Other | Admitting: Internal Medicine

## 2015-04-29 ENCOUNTER — Telehealth: Payer: Self-pay | Admitting: Family Medicine

## 2015-04-29 DIAGNOSIS — R269 Unspecified abnormalities of gait and mobility: Secondary | ICD-10-CM | POA: Diagnosis not present

## 2015-04-29 DIAGNOSIS — I6350 Cerebral infarction due to unspecified occlusion or stenosis of unspecified cerebral artery: Secondary | ICD-10-CM

## 2015-04-29 NOTE — Telephone Encounter (Signed)
Pt needs order for home physical therapy [facility] [place]. The phone  to [facility] is [phone] ext [phone]. Pt is living with her daughter right now.

## 2015-04-30 ENCOUNTER — Other Ambulatory Visit: Payer: Self-pay | Admitting: Family Medicine

## 2015-04-30 DIAGNOSIS — R202 Paresthesia of skin: Secondary | ICD-10-CM

## 2015-04-30 DIAGNOSIS — R269 Unspecified abnormalities of gait and mobility: Secondary | ICD-10-CM

## 2015-04-30 DIAGNOSIS — I639 Cerebral infarction, unspecified: Secondary | ICD-10-CM

## 2015-04-30 DIAGNOSIS — I6350 Cerebral infarction due to unspecified occlusion or stenosis of unspecified cerebral artery: Secondary | ICD-10-CM

## 2015-04-30 NOTE — Telephone Encounter (Signed)
Called the number and they said they needed a referral.  Referral placed.

## 2015-05-01 ENCOUNTER — Telehealth: Payer: Self-pay | Admitting: Family Medicine

## 2015-05-01 MED ORDER — ACCU-CHEK FASTCLIX LANCETS MISC: Status: DC

## 2015-05-01 MED ORDER — GLUCOSE BLOOD VI STRP: ORAL_STRIP | Status: DC

## 2015-05-01 NOTE — Telephone Encounter (Signed)
Rx sent 

## 2015-05-01 NOTE — Telephone Encounter (Signed)
Pt states she has  accu check aviva connect meter  Pt is out of : accu check aviva plus strips  AND ACCU-CHEK FASTCLIX LANCETS MISC 30 day supply only  Pt is staying at her daughters and needs sent to:  [facility]/ [address], [place]  Pt test 1 X day  Then a 90 day sent [facility]

## 2015-05-04 ENCOUNTER — Telehealth: Payer: Self-pay | Admitting: Family Medicine

## 2015-05-04 DIAGNOSIS — I69398 Other sequelae of cerebral infarction: Secondary | ICD-10-CM | POA: Diagnosis not present

## 2015-05-04 DIAGNOSIS — R269 Unspecified abnormalities of gait and mobility: Secondary | ICD-10-CM | POA: Diagnosis not present

## 2015-05-04 DIAGNOSIS — E119 Type 2 diabetes mellitus without complications: Secondary | ICD-10-CM | POA: Diagnosis not present

## 2015-05-04 NOTE — Telephone Encounter (Signed)
Pt has been evaluated  And will be seen twice for 3 wks and once a wk for 1 wk for balancing and strengthening. Pt is not taking metformin and per  pt md is aware . Pt is taking glipizide

## 2015-05-04 NOTE — Telephone Encounter (Signed)
Noted.  Metformin changed as "not taking" on med list.

## 2015-05-07 DIAGNOSIS — E119 Type 2 diabetes mellitus without complications: Secondary | ICD-10-CM | POA: Diagnosis not present

## 2015-05-07 DIAGNOSIS — I69398 Other sequelae of cerebral infarction: Secondary | ICD-10-CM | POA: Diagnosis not present

## 2015-05-07 DIAGNOSIS — R269 Unspecified abnormalities of gait and mobility: Secondary | ICD-10-CM | POA: Diagnosis not present

## 2015-05-11 DIAGNOSIS — E119 Type 2 diabetes mellitus without complications: Secondary | ICD-10-CM | POA: Diagnosis not present

## 2015-05-11 DIAGNOSIS — I69398 Other sequelae of cerebral infarction: Secondary | ICD-10-CM | POA: Diagnosis not present

## 2015-05-11 DIAGNOSIS — R269 Unspecified abnormalities of gait and mobility: Secondary | ICD-10-CM | POA: Diagnosis not present

## 2015-05-14 DIAGNOSIS — E119 Type 2 diabetes mellitus without complications: Secondary | ICD-10-CM | POA: Diagnosis not present

## 2015-05-14 DIAGNOSIS — R269 Unspecified abnormalities of gait and mobility: Secondary | ICD-10-CM | POA: Diagnosis not present

## 2015-05-14 DIAGNOSIS — I69398 Other sequelae of cerebral infarction: Secondary | ICD-10-CM | POA: Diagnosis not present

## 2015-05-18 DIAGNOSIS — E119 Type 2 diabetes mellitus without complications: Secondary | ICD-10-CM | POA: Diagnosis not present

## 2015-05-18 DIAGNOSIS — I69398 Other sequelae of cerebral infarction: Secondary | ICD-10-CM | POA: Diagnosis not present

## 2015-05-18 DIAGNOSIS — R269 Unspecified abnormalities of gait and mobility: Secondary | ICD-10-CM | POA: Diagnosis not present

## 2015-05-20 DIAGNOSIS — R269 Unspecified abnormalities of gait and mobility: Secondary | ICD-10-CM | POA: Diagnosis not present

## 2015-05-20 DIAGNOSIS — I69398 Other sequelae of cerebral infarction: Secondary | ICD-10-CM | POA: Diagnosis not present

## 2015-05-20 DIAGNOSIS — E119 Type 2 diabetes mellitus without complications: Secondary | ICD-10-CM | POA: Diagnosis not present

## 2015-05-26 DIAGNOSIS — E119 Type 2 diabetes mellitus without complications: Secondary | ICD-10-CM | POA: Diagnosis not present

## 2015-05-26 DIAGNOSIS — R269 Unspecified abnormalities of gait and mobility: Secondary | ICD-10-CM | POA: Diagnosis not present

## 2015-05-26 DIAGNOSIS — I69398 Other sequelae of cerebral infarction: Secondary | ICD-10-CM | POA: Diagnosis not present

## 2015-06-03 ENCOUNTER — Encounter: Payer: Self-pay | Admitting: Physical Therapy

## 2015-06-03 ENCOUNTER — Ambulatory Visit: Payer: Medicare Other | Attending: Internal Medicine | Admitting: Physical Therapy

## 2015-06-03 ENCOUNTER — Telehealth: Payer: Self-pay | Admitting: Family Medicine

## 2015-06-03 DIAGNOSIS — R279 Unspecified lack of coordination: Secondary | ICD-10-CM | POA: Diagnosis not present

## 2015-06-03 DIAGNOSIS — I6350 Cerebral infarction due to unspecified occlusion or stenosis of unspecified cerebral artery: Secondary | ICD-10-CM

## 2015-06-03 DIAGNOSIS — R2681 Unsteadiness on feet: Secondary | ICD-10-CM

## 2015-06-03 DIAGNOSIS — R2689 Other abnormalities of gait and mobility: Secondary | ICD-10-CM | POA: Diagnosis not present

## 2015-06-03 DIAGNOSIS — I639 Cerebral infarction, unspecified: Secondary | ICD-10-CM

## 2015-06-03 DIAGNOSIS — I69351 Hemiplegia and hemiparesis following cerebral infarction affecting right dominant side: Secondary | ICD-10-CM | POA: Insufficient documentation

## 2015-06-03 DIAGNOSIS — M6281 Muscle weakness (generalized): Secondary | ICD-10-CM | POA: Insufficient documentation

## 2015-06-03 NOTE — Telephone Encounter (Signed)
Amanda Conway at [facility] re[facility] states the hospital sent referral for PT and OT. They are hoping these orders can come from Dr Sherren Mocha.  Pt has finished home health nursing, but they are needing new orders for the PT and OT. Please put in Epic under [facility].

## 2015-06-03 NOTE — Telephone Encounter (Signed)
Referral placed.

## 2015-06-04 NOTE — Therapy (Signed)
[place] [address] [place], [place], [address] Phone: [phone]   Fax:  [phone]  Physical Therapy Evaluation  Patient Details  Name: Amanda Conway MRN: [id number] Date of Birth: 14-Nov-1940 Referring Provider: Stevie Kern, MD  Encounter Date: 06/03/2015      PT End of Session - 06/03/15 0930    Visit Number 1   Number of Visits 17   Date for PT Re-Evaluation 08/02/15   Authorization Type UHC Medicare   PT Start Time 0849   PT Stop Time 0928   PT Time Calculation (min) 39 min   Equipment Utilized During Treatment Gait belt   Activity Tolerance Patient tolerated treatment well   Behavior During Therapy [facility] for tasks assessed/performed      Past Medical History  Diagnosis Date  . Diabetes mellitus   . Hypertension   . High cholesterol   . Hyperthyroidism     Past Surgical History  Procedure Laterality Date  . Bilateral rotator cuff repair      There were no vitals filed for this visit.       Subjective Assessment - 06/03/15 0856    Subjective This 74yo had a left CVA with right hemiparesis 04/12/2015. She had home health PT until ~1 week ago. She presents for PT evaluation.    Limitations Standing;Walking   Patient Stated Goals She wants to improve balance and right hand & foot work better. She wants to garden again.   Currently in Pain? No/denies            [facility] PT Assessment - 06/03/15 0845    Assessment   Medical Diagnosis CVA   Referring Provider Stevie Kern, MD   Onset Date/Surgical Date 04/12/15   Hand Dominance Right   Prior Therapy HHPT thru 05/26/2015   Precautions   Precautions Fall   Restrictions   Weight Bearing Restrictions No   Balance Screen   Has the patient fallen in the past 6 months No   Has the patient had a decrease in activity level because of a fear of falling?  Yes   Is the patient reluctant to leave their home because of a fear of falling?  Yes    [place] Private residence   Living Arrangements Children   Type of [place] to enter   Entrance Stairs-Number of Steps 3 from lot to building & 8 steps X 2 to apt on 2nd floor   Entrance Stairs-Rails Right;Left;Can reach both   [place] One level   Prior Function   Level of Independence Independent;Independent with gait;Independent with household mobility without device;Independent with community mobility without device   Observation/Other Assessments   Focus on Therapeutic Outcomes (FOTO)  58.38 Functional Status  46 Risk Adjusted   Stroke Impact Scale  77.8%   Fear Avoidance Belief Questionnaire (FABQ)  49 (13)   Posture/Postural Control   Posture/Postural Control No significant limitations   ROM / Strength   AROM / PROM / Strength AROM;Strength   AROM   Overall AROM  Within functional limits for tasks performed   Strength   Overall Strength Deficits   Overall Strength Comments RUE hemiparesis with shoulder grossly 3-4/5 and weak grip   Strength Assessment Site Hip;Knee;Ankle   Right/Left Hip Right   Right Hip Flexion 4/5   Right Hip Extension 3+/5   Right Hip ABduction 3+/5   Right/Left Knee Right   Right Knee Flexion  4/5   Right Knee Extension 4/5   Right/Left Ankle Right   Right Ankle Dorsiflexion 4/5   Right Ankle Plantar Flexion 3/5   Transfers   Transfers Sit to Stand;Stand to Sit   Sit to Stand 6: Modified independent (Device/Increase time);Without upper extremity assist;From chair/3-in-1   Stand to Sit 6: Modified independent (Device/Increase time);Without upper extremity assist;To chair/3-in-1   Ambulation/Gait   Ambulation/Gait Yes   Ambulation/Gait Assistance 4: Min assist;5: Supervision  SBA when concentrating, MinA with multi-task or higher level   Ambulation Distance (Feet) 300 Feet   Assistive device None   Gait Pattern Step-through pattern;Decreased arm swing - right;Decreased step length -  left;Decreased stance time - right;Decreased stride length;Decreased hip/knee flexion - right;Right foot flat;Right flexed knee in stance   Ambulation Surface Indoor;Level   Gait velocity 2.74 ft/sec   Stairs Yes   Stairs Assistance 5: Supervision   Stair Management Technique Two rails;Step to pattern;Forwards   Number of Stairs 4   Standardized Balance Assessment   Standardized Balance Assessment Berg Balance Test;Timed Up and Go Test   Berg Balance Test   Sit to Stand Able to stand without using hands and stabilize independently   Standing Unsupported Able to stand safely 2 minutes   Sitting with Back Unsupported but Feet Supported on Floor or Stool Able to sit safely and securely 2 minutes   Stand to Sit Sits safely with minimal use of hands   Transfers Able to transfer safely, minor use of hands   Standing Unsupported with Eyes Closed Able to stand 10 seconds with supervision   Standing Ubsupported with Feet Together Able to place feet together independently and stand 1 minute safely   From Standing, Reach Forward with Outstretched Arm Can reach confidently >25 cm (10")   From Standing Position, Pick up Object from Floor Able to pick up shoe safely and easily   From Standing Position, Turn to Look Behind Over each Shoulder Looks behind one side only/other side shows less weight shift   Turn 360 Degrees Able to turn 360 degrees safely one side only in 4 seconds or less   Standing Unsupported, Alternately Place Feet on Step/Stool Able to complete 4 steps without aid or supervision   Standing Unsupported, One Foot in Front Able to plae foot ahead of the other independently and hold 30 seconds   Standing on One Leg Able to lift leg independently and hold equal to or more than 3 seconds   Total Score 48   Timed Up and Go Test   Normal TUG (seconds) 11.27   Functional Gait  Assessment   Gait assessed  Yes   Gait Level Surface Walks 20 ft, slow speed, abnormal gait pattern, evidence for  imbalance or deviates 10-15 in outside of the 12 in walkway width. Requires more than 7 sec to ambulate 20 ft.   Change in Gait Speed Makes only minor adjustments to walking speed, or accomplishes a change in speed with significant gait deviations, deviates 10-15 in outside the 12 in walkway width, or changes speed but loses balance but is able to recover and continue walking.   Gait with Horizontal Head Turns Performs head turns with moderate changes in gait velocity, slows down, deviates 10-15 in outside 12 in walkway width but recovers, can continue to walk.   Gait with Vertical Head Turns Performs task with moderate change in gait velocity, slows down, deviates 10-15 in outside 12 in walkway width but recovers, can continue to walk.  Gait and Pivot Turn Turns slowly, requires verbal cueing, or requires several small steps to catch balance following turn and stop   Step Over Obstacle Is able to step over one shoe box (4.5 in total height) but must slow down and adjust steps to clear box safely. May require verbal cueing.   Gait with Narrow Base of Support Ambulates less than 4 steps heel to toe or cannot perform without assistance.   Gait with Eyes Closed Walks 20 ft, slow speed, abnormal gait pattern, evidence for imbalance, deviates 10-15 in outside 12 in walkway width. Requires more than 9 sec to ambulate 20 ft.   Ambulating Backwards Walks 20 ft, slow speed, abnormal gait pattern, evidence for imbalance, deviates 10-15 in outside 12 in walkway width.   Steps Two feet to a stair, must use rail.   Total Score 9                             PT Short Term Goals - 06/03/15 0930    PT SHORT TERM GOAL #1   Title Patient demonstrates initial HEP correctly. (Target Date: 07/03/2015)   Time 4   Period Weeks   Status New   PT SHORT TERM GOAL #2   Title Functional Gait Assessment >/= 14/30 (Target Date: 07/03/2015)   Time 4   Period Weeks   Status New   PT SHORT TERM GOAL #3    Title Patient ambulates 500' without device including ramps & curbs with supervision. (Target Date: 07/03/2015)   Time 4   Status New           PT Long Term Goals - 06/03/15 0930    PT LONG TERM GOAL #1   Title Patient verbalizes understanding of ongoing HEP, fitness plan. (Target Date: 07/31/2015)   Time 8   Period Weeks   Status New   PT LONG TERM GOAL #2   Title Berg Balance Test > 52/56 to indicate lower fall risk. (Target Date: 07/31/2015)   Time 8   Period Weeks   Status New   PT LONG TERM GOAL #3   Title Functional Gait Assessment >/= 19/30 to indicate lower fall risk. (Target Date: 07/31/2015)   Time 8   Period Weeks   Status New   PT LONG TERM GOAL #4   Title Patient ambulates 1000' including grass, ramps, curbs, stairs (1 rail) reciprocal without device independently to enable safe community access. (Target Date: 07/31/2015)   Time 8   Period Weeks   Status New   PT LONG TERM GOAL #5   Title Patient demonstrates tasks to enable return to gardening modified independent safely. (Target Date: 07/31/2015)   Time 8   Period Weeks   Status New               Plan - 06/03/15 0930    Clinical Impression Statement This 75yo female has weakness, hemiparesis of dominant right side limiting function and mobility. Berg Balance score of 48/56 indicates fall risk. Functional Gait Assessment 9/30 (<19/30) indicates fall risk. Her gait requires concentration to ambulate alone and occasional minA with multi-tasking, negotiating obstacle or scanning environment while ambulating. Patient's dominant right UE has weakness and impaired coordination. She appears would benefit from Occupational Therapy referral for evaluation and treatment as indicated.    Rehab Potential Good   PT Frequency 2x / week   PT Duration 8 weeks   PT Treatment/Interventions ADLs/Self Care Home Management;Gait  training;Stair training;Functional mobility training;Therapeutic activities;Therapeutic exercise;Balance  training;Neuromuscular re-education;Patient/family education   PT Next Visit Plan check current & update HEP including flexibilty, balance, strength & endurance components   Recommended Other Services OT evaluation   Consulted and Agree with Plan of Care Patient      Patient will benefit from skilled therapeutic intervention in order to improve the following deficits and impairments:  Abnormal gait, Decreased activity tolerance, Decreased balance, Decreased endurance, Decreased mobility, Decreased strength  Visit Diagnosis: Other abnormalities of gait and mobility  Unsteadiness on feet  Hemiplegia and hemiparesis following cerebral infarction affecting right dominant side (HCC)  Unspecified lack of coordination      G-Codes - 06/26/2015 1023    Functional Assessment Tool Used Functional Gait Assessment 9/30   Functional Limitation Mobility: Walking and moving around   Mobility: Walking and Moving Around Current Status [phone]) At least 60 percent but less than 80 percent impaired, limited or restricted   Mobility: Walking and Moving Around Goal Status [phone]) At least 20 percent but less than 40 percent impaired, limited or restricted       Problem List Patient Active Problem List   Diagnosis Date Noted  . Cerebrovascular accident (CVA) due to occlusion of cerebral artery ([place])   . Stroke ([place]) 04/12/2015  . CVA (cerebral infarction) 04/12/2015  . Type 2 diabetes mellitus ([place]) 04/12/2015  . History of hyperthyroidism 04/12/2015  . Elbow fracture, right 03/01/2012  . Dysplastic nevus of trunk 03/01/2012  . 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 CONTACT DERMATITIS&OTHER ECZEMA DUE TO PLANTS 12/28/2009  . Hyperlipidemia 01/17/2007  . Essential hypertension 01/17/2007    Jesse Hirst PT, DPT 06/04/2015, 10:32 AM  [facility] [address] [place], [place], [address] Phone: [phone]   Fax:   [phone]  Name: Amanda Conway MRN: [id number] Date of Birth: 1941/01/15

## 2015-06-11 ENCOUNTER — Ambulatory Visit: Payer: Medicare Other | Admitting: Occupational Therapy

## 2015-06-11 ENCOUNTER — Encounter: Payer: Self-pay | Admitting: Occupational Therapy

## 2015-06-11 DIAGNOSIS — R2689 Other abnormalities of gait and mobility: Secondary | ICD-10-CM | POA: Diagnosis not present

## 2015-06-11 DIAGNOSIS — R2681 Unsteadiness on feet: Secondary | ICD-10-CM | POA: Diagnosis not present

## 2015-06-11 DIAGNOSIS — I69351 Hemiplegia and hemiparesis following cerebral infarction affecting right dominant side: Secondary | ICD-10-CM | POA: Diagnosis not present

## 2015-06-11 DIAGNOSIS — M6281 Muscle weakness (generalized): Secondary | ICD-10-CM | POA: Diagnosis not present

## 2015-06-11 DIAGNOSIS — R279 Unspecified lack of coordination: Secondary | ICD-10-CM | POA: Diagnosis not present

## 2015-06-11 NOTE — Therapy (Signed)
[facility] [address] [place], [place], [address] Phone: [phone]   Fax:  [phone]  Occupational Therapy Evaluation  Patient Details  Name: Amanda Conway MRN: [id number] Date of Birth: 28-Jul-1940 Referring Provider: Dr. Stevie Conway  Encounter Date: 06/11/2015      OT End of Session - 06/11/15 1602    Visit Number 1   Number of Visits 3   Date for OT Re-Evaluation 07/09/15   Authorization Type UHC medicare - will need G code   Authorization Time Period 60 days   OT Start Time 1315   OT Stop Time 1355   OT Time Calculation (min) 40 min   Activity Tolerance Patient tolerated treatment well      Past Medical History  Diagnosis Date  . Diabetes mellitus   . Hypertension   . High cholesterol   . Hyperthyroidism     Past Surgical History  Procedure Laterality Date  . Bilateral rotator cuff repair      There were no vitals filed for this visit.      Subjective Assessment - 06/11/15 1321    Subjective  It feels like I have a tight glove on my fingers.    Pertinent History see epic snapshot   Patient Stated Goals get my fingers and toes feeling normal.    Currently in Pain? No/denies           [facility] OT Assessment - 06/11/15 0001    Assessment   Diagnosis L CVA   Referring Provider Dr. Stevie Conway   Onset Date 04/12/15   Prior Therapy acute PT and OT;  HHPT and now getting outpt PT   Precautions   Precautions Fall   Restrictions   Weight Bearing Restrictions No   Balance Screen   Has the patient fallen in the past 6 months No   Home  Environment   Family/patient expects to be discharged to: Private residence   Living Arrangements Children  adult dtr   Available Help at Discharge Family   Type of [place] One level   Bathroom Ambulance person   Additional Comments Pt has seat in shower, holds onto the seat getting in and  out,    Prior Function   Level of Independence Independent   ADL   Eating/Feeding Independent   Grooming Independent   Upper Body Bathing Modified independent   Lower Body Bathing Modified independent   Upper Body Dressing Independent   Lower Body Dressing Independent   Engineering geologist - Water engineer -  Development worker, community Modified independent   IADL   Shopping Shops independently for small [place] alone or with occasional assistance   Meal Prep Plans, prepares and serves adequate meals independently   Programmer, applications own vehicle   Medication Management Is responsible for taking medication in correct dosages at correct time   Physiological scientist financial matters independently (budgets, writes checks, pays rent, bills goes to bank), collects and keeps track of income   Mobility   Mobility Status Independent   Written Expression   Dominant Hand Right   Vision - History   Baseline Vision Wears glasses only for reading   Vision Assessment   Comment Pt denies changes in vision since stroke - feels her acuity has declined some over the past year but no  related to stroke   Activity Tolerance   Activity Tolerance Tolerate 30+ min activity without fatigue   Cognition   Mini Mental State Exam  Pt able to attend to 40 minute evaluation session in busy environment without redirection. Pt with no apparent cognitive deficits during assessment for tasks assessed.    Attention Sustained  pt states she feels slight change in sustained attention   Sensation   Light Touch Impaired by gross assessment  on palm side of ring finger and little finger only   Hot/Cold Appears Intact   Proprioception Appears Intact   Coordination   Gross Motor Movements are Fluid and Coordinated Yes  states shoulder feels tight but has functional AROM   Fine Motor Movements are  Fluid and Coordinated Yes  for finger to nose; slowed for BUE's   9 Hole Peg Test Right   Right 9 Hole Peg Test 23.61   Other Pt has had surgery on both shoulders due to falls - 7 years ago for L and 12 years ago for R   Tone   Assessment Location Right Upper Extremity   ROM / Strength   AROM / PROM / Strength AROM;Strength   AROM   Overall AROM  Within functional limits for tasks performed   Overall AROM Comments Pt reports shoulders feel tight howver has full AROM. Pt with h/o of shoulder surgeries on both shoulders - reports she did not gain full strength back on R after surgery but feels RUE is slightly weaker since stroke.    Strength   Overall Strength Deficits   Overall Strength Comments RUE - 3+/5 for shoulder flexion/abduction however h/o of shoulder surgery with some residual weakness. Pt feels it is slightly weaker than before the stroke.    Hand Function   Right Hand Gross Grasp Functional   Right Hand Grip (lbs) 48   Right Hand Lateral Pinch 11 lbs   Right Hand 3 Point Pinch 9 lbs   Left Hand Gross Grasp Functional   Left Hand Grip (lbs) 41   Left Hand Lateral Pinch 10 lbs   Left 3 point pinch 8 lbs   RUE Tone   RUE Tone Within Functional Limits                              OT Long Term Goals - 06/11/15 1556    OT LONG TERM GOAL #1   Title Pt wtill be mod I with HEP for proximal strengthening of RUE to improve functional use.- 07/09/2015   Status New               Plan - 06/11/15 1557    Clinical Impression Statement Pt is a 75 year old female s/p L CVA on 04/12/2015. Pt was d/c from the hospital on 04/14/2015 and then received HHPT. Pt has PMH for HTN, HLD, hypothyroidism, DM and SVD. Pt presents today with mild hemiplegia in dominant RUE (proximal shoulder) and some mild sensory deficts in L ring finger and little finger.  Pt will benefit from  2 visits to develop safe HEP to address proximal weakness.  Pt in agreement.    Rehab Potential  Good   Clinical Impairments Affecting Rehab Potential pt has h/o of shoulder surgery bilaterally so will need to develop HEP to address strength without inflamming shoulder.    OT Frequency Other (comment)  2 visits   OT Duration 4 weeks  to allow time to  schedule   OT Treatment/Interventions Therapeutic exercise;Patient/family education;Therapeutic activities   Plan issue HEP   Consulted and Agree with Plan of Care Patient      Patient will benefit from skilled therapeutic intervention in order to improve the following deficits and impairments:  Decreased strength, Impaired sensation  Visit Diagnosis: Muscle weakness (generalized) - Plan: Ot plan of care cert/re-cert      G-Codes - [id number]    Functional Assessment Tool Used MMT   Functional Limitation Self care   Self Care Current Status [phone]) 100 percent impaired, limited or restricted  pt dependent for HEP at time of eval   Self Care Goal Status [id number]) 0 percent impaired, limited or restricted  for HEP      Problem List Patient Active Problem List   Diagnosis Date Noted  . Cerebrovascular accident (CVA) due to occlusion of cerebral artery ([place])   . Stroke ([place]) 04/12/2015  . CVA (cerebral infarction) 04/12/2015  . Type 2 diabetes mellitus ([place]) 04/12/2015  . History of hyperthyroidism 04/12/2015  . Elbow fracture, right 03/01/2012  . Dysplastic nevus of trunk 03/01/2012  . 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 CONTACT DERMATITIS&OTHER ECZEMA DUE TO PLANTS 12/28/2009  . Hyperlipidemia 01/17/2007  . Essential hypertension 01/17/2007    Quay Burow, OTR/L 06/11/2015, 4:07 PM  [facility] [address] [place], [place], [address] Phone: [phone]   Fax:  [phone]  Name: KEIRI TRITLE MRN: [id number] Date of Birth: [birth year]

## 2015-06-16 ENCOUNTER — Ambulatory Visit: Payer: Medicare Other | Admitting: Physical Therapy

## 2015-06-16 ENCOUNTER — Encounter: Payer: Self-pay | Admitting: Physical Therapy

## 2015-06-16 ENCOUNTER — Ambulatory Visit: Payer: Medicare Other | Admitting: Occupational Therapy

## 2015-06-16 ENCOUNTER — Encounter: Payer: Self-pay | Admitting: Occupational Therapy

## 2015-06-16 DIAGNOSIS — M6281 Muscle weakness (generalized): Secondary | ICD-10-CM

## 2015-06-16 DIAGNOSIS — R2681 Unsteadiness on feet: Secondary | ICD-10-CM | POA: Diagnosis not present

## 2015-06-16 DIAGNOSIS — R2689 Other abnormalities of gait and mobility: Secondary | ICD-10-CM

## 2015-06-16 DIAGNOSIS — I69351 Hemiplegia and hemiparesis following cerebral infarction affecting right dominant side: Secondary | ICD-10-CM | POA: Diagnosis not present

## 2015-06-16 DIAGNOSIS — R279 Unspecified lack of coordination: Secondary | ICD-10-CM | POA: Diagnosis not present

## 2015-06-16 NOTE — Patient Instructions (Signed)
Bridging    Slowly raise buttocks from floor, keeping stomach tight. Hold for 5 seconds. Repeat __10_ times per set.  Do __1-2__ sessions per day.  http://orth.exer.us/1096   Copyright  VHI. All rights reserved.   Hip Flexor Stretch    Lying on back with right side near edge of bed, bend both legs: lift right foot up off bed and lower it down toward the floor, then lift it back up to place foot back onto bed. Keep knee bent with movement. Perform 10 reps, 1-2 times a day http://gt2.exer.us/347   Copyright  VHI. All rights reserved.   Functional Quadriceps: Sit to Stand    Sit on edge of chair, feet flat on floor. Stand upright, extending knees fully, with arms crossed over chest. Repeat __10_ times per set.  Do _1-2__ sessions per day.  http://orth.exer.us/735   Copyright  VHI. All rights reserved.   Chair Sitting    Sit at edge of seat, spine straight, one leg extended. Put a hand on each thigh and bend forward from the hip, keeping spine straight. Allow hand on extended leg to reach toward toes. Support upper body with other arm. Hold __20_ seconds. Repeat _3__ times each leg. Do _1-2 sessions per day.  Copyright  VHI. All rights reserved.   Walking on Toes    At counter top for support as needed: walk on toes forward and then backwards. Repeat for 3 laps each way.  Copyright  VHI. All rights reserved.  Walking on Heels    At counter top for support: walk on heels forward and then backwards. Repeat for 3 laps each way. Copyright  VHI. All rights reserved.

## 2015-06-16 NOTE — Therapy (Signed)
[facility] [address] [place], [place], [address] Phone: [phone]   Fax:  [phone]  Occupational Therapy Treatment  Patient Details  Name: Amanda Conway MRN: [id number] Date of Birth: 1940-07-10 Referring Provider: Dr. Stevie Kern  Encounter Date: 06/16/2015      OT End of Session - 06/16/15 1706    Visit Number 2   Number of Visits 3   Date for OT Re-Evaluation 07/09/15   Authorization Type UHC medicare - will need G code   Authorization Time Period 60 days   OT Start Time 1616   OT Stop Time 1701   OT Time Calculation (min) 45 min   Activity Tolerance Patient tolerated treatment well      Past Medical History  Diagnosis Date  . Diabetes mellitus   . Hypertension   . High cholesterol   . Hyperthyroidism     Past Surgical History  Procedure Laterality Date  . Bilateral rotator cuff repair      There were no vitals filed for this visit.      Subjective Assessment - 06/16/15 1619    Pertinent History see epic snapshot   Patient Stated Goals get my fingers and toes feeling normal.    Currently in Pain? Yes   Pain Location Ankle  pain    Pain Orientation Right   Pain Descriptors / Indicators Pins and needles;Tightness   Pain Type Acute pain   Pain Onset In the past 7 days   Pain Frequency Intermittent   Aggravating Factors  certain movements, ,maybe the exercises the PT gave me.    Pain Relieving Factors rest, avoid certain movements.                      OT Treatments/Exercises (OP) - 06/16/15 0001    Exercises   Exercises Shoulder   Shoulder Exercises: Supine   Other Supine Exercises Developed HEP to address UE strength - pt able to do all exercises 12 reps x2 with no pain or discomfort.  See pt instruction section for details.  Pt verbalized understanding of all exercises. Program devloped with less resistance due to pt's h/o of shoulder surgery.                  OT Education - 06/16/15 1702    Education provided Yes   Education Details HEP for UE strength   Person(s) Educated Patient   Methods Explanation;Demonstration;Verbal cues;Handout   Comprehension Verbalized understanding;Returned demonstration             OT Long Term Goals - 06/16/15 1705    OT LONG TERM GOAL #1   Title Pt wtill be mod I with HEP for proximal strengthening of RUE to improve functional use.- 07/09/2015   Status On-going               Plan - 06/16/15 1705    Clinical Impression Statement Pt progressing toward goals. Pt to return for one more visit to ensure understanding of HEP and to make any necessary modifications.   Rehab Potential Good   Clinical Impairments Affecting Rehab Potential pt has h/o of shoulder surgery bilaterally so will need to develop HEP to address strength without inflamming shoulder.    OT Frequency Other (comment)   OT Duration 4 weeks   OT Treatment/Interventions Therapeutic exercise;Patient/family education;Therapeutic activities   Plan check HEP, make adjustments prn, d/c from OT   Consulted and Agree with Plan of Care Patient  Patient will benefit from skilled therapeutic intervention in order to improve the following deficits and impairments:  Decreased strength, Impaired sensation  Visit Diagnosis: Muscle weakness (generalized)    Problem List Patient Active Problem List   Diagnosis Date Noted  . Cerebrovascular accident (CVA) due to occlusion of cerebral artery ([place])   . Stroke ([place]) 04/12/2015  . CVA (cerebral infarction) 04/12/2015  . Type 2 diabetes mellitus ([place]) 04/12/2015  . History of hyperthyroidism 04/12/2015  . Elbow fracture, right 03/01/2012  . Dysplastic nevus of trunk 03/01/2012  . 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 CONTACT DERMATITIS&OTHER ECZEMA DUE TO PLANTS 12/28/2009  . Hyperlipidemia 01/17/2007  . Essential hypertension 01/17/2007    Quay Burow, OTR/L 06/16/2015, 5:07 PM  [facility] [address] [place], [place], [address] Phone: [phone]   Fax:  [phone]  Name: Amanda Conway MRN: [id number] Date of Birth: Dec 22, 1940

## 2015-06-16 NOTE — Patient Instructions (Addendum)
Arm Exercises to do at home to build up your strength.STOP if you get pain and let your therapist know.  Some muscle soreness is ok just no joint pain.  1. Lay on your back.  Hold 3 pound weight in both hands, palms facing each other.  Slowly lift weight straight up toward the ceiling until your elbows are straight, then lower down to your chest.  Do 12, rest then do 12 more.    2. Lay on your back. Hold a 2 pound weight (or water bottle or soda bottle filled with some water)   in both hands, palm facing each other. Raise weight straight toward ceiling until elbows are straight.  KEEPING ELBOWS STRAIGHT, reach weight toward your knees and then back to starting position. Do not go beyond your chest (do not go over your head). Do 12, rest then do 12 more.  3. Lay on your back.  Hold large beach ball between your hands.  Raise ball up toward the ceiling until elbows are straight.  Keeping elbows straight, slowly move the ball to the left and then to right. Do 12, rest then do 12 more. ONLY work with your comfort limits. DO NOT GO BEYOND WHAT FEELS COMFORTABLE.  Left to right is 1, right to left is 2 and so on.   Theraband: You can adjust the tension on the band by making it longer (less tension) or shorter (more tension).   4. Sit on a firm surface, feet flat on floor.  Place one end of yellow band under your feet. Wrap the other end around your right hand, palm facing up. Keeping shoulder still, bend elbow and then straighten. Do 12 then alternate with left arm. Then do 12 more with right arm again followed by 12 more with left arm.    5. Tie one end of the theraband to a door handle.  Stand facing the door with your arm at your side and your elbow straight. Wrap other end of theraband around your hand.  KEEPING YOUR ELBOW STRAIGHT, pull band straight back then slowly return to starting position. Do 12 repetitions, 2 sets on each arm (alternate arms to give them a rest).

## 2015-06-18 ENCOUNTER — Encounter: Payer: Self-pay | Admitting: Physical Therapy

## 2015-06-18 ENCOUNTER — Ambulatory Visit: Payer: Medicare Other | Admitting: Occupational Therapy

## 2015-06-18 ENCOUNTER — Encounter: Payer: Self-pay | Admitting: Occupational Therapy

## 2015-06-18 ENCOUNTER — Ambulatory Visit: Payer: Medicare Other | Admitting: Physical Therapy

## 2015-06-18 DIAGNOSIS — I69351 Hemiplegia and hemiparesis following cerebral infarction affecting right dominant side: Secondary | ICD-10-CM | POA: Diagnosis not present

## 2015-06-18 DIAGNOSIS — R279 Unspecified lack of coordination: Secondary | ICD-10-CM | POA: Diagnosis not present

## 2015-06-18 DIAGNOSIS — M6281 Muscle weakness (generalized): Secondary | ICD-10-CM

## 2015-06-18 DIAGNOSIS — R2689 Other abnormalities of gait and mobility: Secondary | ICD-10-CM

## 2015-06-18 DIAGNOSIS — R2681 Unsteadiness on feet: Secondary | ICD-10-CM

## 2015-06-18 NOTE — Patient Instructions (Signed)
Arm Exercises to do at home to build up your strength.STOP if you get pain and let your therapist know. Some muscle soreness is ok just no joint pain.  1. Lay on your back. Hold 3 pound weight in both hands, palms facing each other. Slowly lift weight straight up toward the ceiling until your elbows are straight, then lower down to your chest. Do 12, rest then do 12 more.   2. Lay on your back. Hold a 2 pound weight (or water bottle or soda bottle filled with some water) in both hands, palm facing each other. Raise weight straight toward ceiling until elbows are straight. KEEPING ELBOWS STRAIGHT, reach weight toward your knees and then back to starting position. Do not go beyond your chest (do not go over your head). Do 12, rest then do 12 more.  3. Lay on your back. Hold large beach ball between your hands. Raise ball up toward the ceiling until elbows are straight. Keeping elbows straight, slowly move the ball to the left and then to right. Do 12, rest then do 12 more. ONLY work with your comfort limits. DO NOT GO BEYOND WHAT FEELS COMFORTABLE. Left to right is 1, right to left is 2 and so on.   Theraband: You can adjust the tension on the band by making it longer (less tension) or shorter (more tension).  4. Sit on a firm surface, feet flat on floor. Place one end of yellow band under your feet. Wrap the other end around your right hand, palm facing up. Keeping shoulder still, bend elbow and then straighten. Do 12 then alternate with left arm. Then do 12 more with right arm again followed by 12 more with left arm.   5. Tie one end of the theraband to a door handle. Stand facing the door with your arm at your side and your elbow straight. Wrap other end of theraband around your hand. KEEPING YOUR ELBOW STRAIGHT, pull band straight back then slowly return to starting position. Do 12 repetitions, 2 sets on each arm (alternate arms to give them a rest).    Make sure you rest between  sets as this gives your muscles a chance to recoup.  As you do this at home and get stronger you can add repetitions (i.e. Go from 12-15 to 18 to 20).  DO NOT ADD WEIGHT given your history with your shoulders.  Do not exceed 20 repetitions.  You can increase the resistance with the yellow band as you get stronger - the shorter the band the more resistance and the longer the band the less resistance. Adjust this as you need to.

## 2015-06-18 NOTE — Therapy (Signed)
[facility] [address] [place], [place], [address] Phone: [phone]   Fax:  [phone]  Physical Therapy Treatment  Patient Details  Name: Amanda Conway MRN: [id number] Date of Birth: [birth year] Referring Provider: Stevie Kern, MD  Encounter Date: 06/16/2015   06/16/15 1459  PT Visits / Re-Eval  Visit Number 2  Number of Visits 17  Date for PT Re-Evaluation 08/02/15  Authorization  Authorization Type UHC Medicare  PT Time Calculation  PT Start Time [id number] (pt running late today)  PT Stop Time 1530  PT Time Calculation (min) 38 min  PT - End of Session  Equipment Utilized During Treatment Gait belt  Activity Tolerance Patient tolerated treatment well  Behavior During Therapy [facility] for tasks assessed/performed     Past Medical History  Diagnosis Date  . Diabetes mellitus   . Hypertension   . High cholesterol   . Hyperthyroidism     Past Surgical History  Procedure Laterality Date  . Bilateral rotator cuff repair      There were no vitals filed for this visit.     06/16/15 1456  Symptoms/Limitations  Subjective No new complaints. No falls to report. Has some right finger and knee/foot are hurting today.   Limitations Standing;Walking  Pain Assessment  Currently in Pain? Yes  Pain Score 5  Pain Location Other (Comment) (areas of right side)  Pain Orientation Right  Pain Descriptors / Indicators Pins and needles (stiffness in ankle)  Pain Type Acute pain  Pain Onset 1 to 4 weeks ago  Pain Frequency Constant  Aggravating Factors  cold, wet weather  Pain Relieving Factors pain medicine before bed at night     Treatment: Educated on HEP for home. Refer to pt instructions for full details.       06/16/15 1207  PT Education  Education provided Yes  Education Details HEP for strengthening and balance  Person(s) Educated Patient  Methods Explanation;Demonstration;Verbal cues;Handout   Comprehension Verbalized understanding;Need further instruction;Returned demonstration          PT Short Term Goals - 06/03/15 0930    PT SHORT TERM GOAL #1   Title Patient demonstrates initial HEP correctly. (Target Date: 07/03/2015)   Time 4   Period Weeks   Status New   PT SHORT TERM GOAL #2   Title Functional Gait Assessment >/= 14/30 (Target Date: 07/03/2015)   Time 4   Period Weeks   Status New   PT SHORT TERM GOAL #3   Title Patient ambulates 500' without device including ramps & curbs with supervision. (Target Date: 07/03/2015)   Time 4   Status New           PT Long Term Goals - 06/03/15 0930    PT LONG TERM GOAL #1   Title Patient verbalizes understanding of ongoing HEP, fitness plan. (Target Date: 07/31/2015)   Time 8   Period Weeks   Status New   PT LONG TERM GOAL #2   Title Berg Balance Test > 52/56 to indicate lower fall risk. (Target Date: 07/31/2015)   Time 8   Period Weeks   Status New   PT LONG TERM GOAL #3   Title Functional Gait Assessment >/= 19/30 to indicate lower fall risk. (Target Date: 07/31/2015)   Time 8   Period Weeks   Status New   PT LONG TERM GOAL #4   Title Patient ambulates 1000' including grass, ramps, curbs, stairs (1 rail) reciprocal without device independently to  enable safe community access. (Target Date: 07/31/2015)   Time 8   Period Weeks   Status New   PT LONG TERM GOAL #5   Title Patient demonstrates tasks to enable return to gardening modified independent safely. (Target Date: 07/31/2015)   Time 8   Period Weeks   Status New        06/16/15 1500  Plan  Clinical Impression Statement Today's skilled session focused on establishment of an HEP for home. Pt able to perform in session without any issues reported. Pt is making steady progress toward goals.  Pt will benefit from skilled therapeutic intervention in order to improve on the following deficits Abnormal gait;Decreased activity tolerance;Decreased balance;Decreased  endurance;Decreased mobility;Decreased strength  Rehab Potential Good  PT Frequency 2x / week  PT Duration 8 weeks  PT Treatment/Interventions ADLs/Self Care Home Management;Gait training;Stair training;Functional mobility training;Therapeutic activities;Therapeutic exercise;Balance training;Neuromuscular re-education;Patient/family education  PT Next Visit Plan continue to work towards [place].   Consulted and Agree with Plan of Care Patient          Patient will benefit from skilled therapeutic intervention in order to improve the following deficits and impairments:  Abnormal gait, Decreased activity tolerance, Decreased balance, Decreased endurance, Decreased mobility, Decreased strength  Visit Diagnosis: Muscle weakness (generalized)  Other abnormalities of gait and mobility  Unsteadiness on feet     Problem List Patient Active Problem List   Diagnosis Date Noted  . Cerebrovascular accident (CVA) due to occlusion of cerebral artery ([place])   . Stroke ([place]) 04/12/2015  . CVA (cerebral infarction) 04/12/2015  . Type 2 diabetes mellitus ([place]) 04/12/2015  . History of hyperthyroidism 04/12/2015  . Elbow fracture, right 03/01/2012  . Dysplastic nevus of trunk 03/01/2012  . 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 CONTACT DERMATITIS&OTHER ECZEMA DUE TO PLANTS 12/28/2009  . Hyperlipidemia 01/17/2007  . Essential hypertension 01/17/2007    Willow Ora, PTA, [facility] [address] [place], [place] [address] [phone] 06/18/2015, 12:04 PM  Name: Amanda Conway MRN: [id number] Date of Birth: [birth year]

## 2015-06-18 NOTE — Therapy (Signed)
[facility] [address] [place], [place], [address] Phone: [phone]   Fax:  [phone]  Physical Therapy Treatment  Patient Details  Name: Amanda Conway MRN: [id number] Date of Birth: [birth year] Referring Provider: Stevie Kern, MD  Encounter Date: 06/18/2015      PT End of Session - 06/18/15 1327    Visit Number 3   Number of Visits 17   Date for PT Re-Evaluation 08/02/15   Authorization Type UHC Medicare   PT Start Time 1321   PT Stop Time 1400   PT Time Calculation (min) 39 min   Equipment Utilized During Treatment Gait belt   Activity Tolerance Patient tolerated treatment well   Behavior During Therapy [facility] for tasks assessed/performed      Past Medical History  Diagnosis Date  . Diabetes mellitus   . Hypertension   . High cholesterol   . Hyperthyroidism     Past Surgical History  Procedure Laterality Date  . Bilateral rotator cuff repair      There were no vitals filed for this visit.      Subjective Assessment - 06/18/15 1326    Subjective No new complaint. No falls or pain to report today.   Limitations Standing;Walking   Patient Stated Goals She wants to improve balance and right hand & foot work better. She wants to garden again.   Currently in Pain? No/denies   Pain Score 0-No pain           [facility] Adult PT Treatment/Exercise - 06/18/15 1327    Transfers   Transfers Sit to Stand;Stand to Sit   Sit to Stand 6: Modified independent (Device/Increase time);Without upper extremity assist;From chair/3-in-1   Stand to Sit 6: Modified independent (Device/Increase time);Without upper extremity assist;To chair/3-in-1   Ambulation/Gait   Ambulation/Gait Yes   Ambulation/Gait Assistance 5: Supervision   Ambulation/Gait Assistance Details no balance issues noted, no foot drag noted.   Ambulation Distance (Feet) 1000 Feet   Assistive device None   Gait Pattern Step-through  pattern;Decreased stride length   Ambulation Surface Unlevel;Level;Indoor;Outdoor;Paved;Grass           Balance Exercises - 06/18/15 1342    Balance Exercises: Standing   Rockerboard Anterior/posterior;Lateral;Head turns;EC;20 seconds;10 reps   Gait with Head Turns Forward;2 reps;Limitations   Balance Exercises: Standing   Rebounder Limitations both ways on the rocker board without UE support:   Tandem Gait Limitations in ~50 foot hallway: forward gait with head turns left<>right and nods up/down x 2 laps each with min guard assist and cues to maintain walking speed                                   PT Education - 06/18/15 1400    Education provided Yes   Education Details walking program   Person(s) Educated Patient   Methods Explanation;Demonstration;Handout   Comprehension Verbalized understanding;Returned demonstration          PT Short Term Goals - 06/03/15 0930    PT SHORT TERM GOAL #1   Title Patient demonstrates initial HEP correctly. (Target Date: 07/03/2015)   Time 4   Period Weeks   Status New   PT SHORT TERM GOAL #2   Title Functional Gait Assessment >/= 14/30 (Target Date: 07/03/2015)   Time 4   Period Weeks   Status New   PT SHORT TERM GOAL #3   Title Patient ambulates  500' without device including ramps & curbs with supervision. (Target Date: 07/03/2015)   Time 4   Status New           PT Long Term Goals - 06/03/15 0930    PT LONG TERM GOAL #1   Title Patient verbalizes understanding of ongoing HEP, fitness plan. (Target Date: 07/31/2015)   Time 8   Period Weeks   Status New   PT LONG TERM GOAL #2   Title Berg Balance Test > 52/56 to indicate lower fall risk. (Target Date: 07/31/2015)   Time 8   Period Weeks   Status New   PT LONG TERM GOAL #3   Title Functional Gait Assessment >/= 19/30 to indicate lower fall risk. (Target Date: 07/31/2015)   Time 8   Period Weeks   Status New   PT LONG TERM GOAL #4   Title Patient ambulates 1000' including  grass, ramps, curbs, stairs (1 rail) reciprocal without device independently to enable safe community access. (Target Date: 07/31/2015)   Time 8   Period Weeks   Status New   PT LONG TERM GOAL #5   Title Patient demonstrates tasks to enable return to gardening modified independent safely. (Target Date: 07/31/2015)   Time 8   Period Weeks   Status New            Plan - 06/18/15 1327    Clinical Impression Statement continued to work on gait and balance activities today without issues reported. Added walking program to HEP. Pt is making great progress toward goals.   Rehab Potential Good   PT Frequency 2x / week   PT Duration 8 weeks   PT Treatment/Interventions ADLs/Self Care Home Management;Gait training;Stair training;Functional mobility training;Therapeutic activities;Therapeutic exercise;Balance training;Neuromuscular re-education;Patient/family education   PT Next Visit Plan continue to work towards [place].    Consulted and Agree with Plan of Care Patient      Patient will benefit from skilled therapeutic intervention in order to improve the following deficits and impairments:  Abnormal gait, Decreased activity tolerance, Decreased balance, Decreased endurance, Decreased mobility, Decreased strength  Visit Diagnosis: Muscle weakness (generalized)  Other abnormalities of gait and mobility  Unsteadiness on feet  Unspecified lack of coordination     Problem List Patient Active Problem List   Diagnosis Date Noted  . Cerebrovascular accident (CVA) due to occlusion of cerebral artery ([place])   . Stroke ([place]) 04/12/2015  . CVA (cerebral infarction) 04/12/2015  . Type 2 diabetes mellitus ([place]) 04/12/2015  . History of hyperthyroidism 04/12/2015  . Elbow fracture, right 03/01/2012  . Dysplastic nevus of trunk 03/01/2012  . 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 CONTACT DERMATITIS&OTHER ECZEMA DUE TO PLANTS 12/28/2009  . Hyperlipidemia 01/17/2007  . Essential hypertension  01/17/2007    Willow Ora, PTA, [facility] [address], [place], [place] [address] [phone] 06/19/2015, 3:12 PM   Name: Amanda Conway MRN: [id number] Date of Birth: 1941-02-02

## 2015-06-18 NOTE — Patient Instructions (Signed)
Walking Program:  Begin walking for exercise for 5 minutes, 2 times/day, 5 days/week.   Progress your walking program by adding 2-3 minutes to your routine each week, as tolerated. Be sure to wear good walking shoes, walk in a safe environment and only progress to your tolerance.      ** Can also walk at parks that have benches for rest breaks**

## 2015-06-18 NOTE — Therapy (Signed)
[facility] [address] [place], [place], [address] Phone: [phone]   Fax:  [phone]  Occupational Therapy Treatment  Patient Details  Name: Amanda Conway MRN: [id number] Date of Birth: 06-Jul-1940 Referring Provider: Dr. Stevie Kern  Encounter Date: 06/18/2015      OT End of Session - 06/18/15 1739    Visit Number 3   Number of Visits 3   Date for OT Re-Evaluation 07/09/15   Authorization Type UHC medicare - will need G code   Authorization Time Period 60 days   OT Start Time 1447   OT Stop Time 1529   OT Time Calculation (min) 42 min   Activity Tolerance Patient tolerated treatment well      Past Medical History  Diagnosis Date  . Diabetes mellitus   . Hypertension   . High cholesterol   . Hyperthyroidism     Past Surgical History  Procedure Laterality Date  . Bilateral rotator cuff repair      There were no vitals filed for this visit.      Subjective Assessment - 06/18/15 1454    Subjective  I can feel some muscle soreness when I first do the exercises but then it goes away.    Pertinent History see epic snapshot   Patient Stated Goals get my fingers and toes feeling normal.    Currently in Pain? No/denies                      OT Treatments/Exercises (OP) - 06/18/15 1737    Exercises   Exercises Shoulder   Shoulder Exercises: Supine   Other Supine Exercises Reviwed entire HEP to ensure pt's understanding and to ensure good body mechanics.  Pt stated she had no pain from exercises and needed only 2-3 vc's.  Also educated pt on how to increase challenge at home as she becomes stronger. Pt able to verbalize understanding.              Balance Exercises - 06/18/15 1342    Balance Exercises: Standing   Rockerboard Anterior/posterior;Lateral;Head turns;EC;20 seconds;10 reps   Gait with Head Turns Forward;2 reps;Limitations   Balance Exercises: Standing   Rebounder  Limitations both ways on the rocker board without UE support:   Tandem Gait Limitations in ~50 foot hallway: forward gait with head turns left<>right and nods up/down x 2 laps each with min guard assist and cues to maintain walking speed                                        OT Long Term Goals - 06/18/15 1738    OT LONG TERM GOAL #1   Title Pt wtill be mod I with HEP for proximal strengthening of RUE to improve functional use.- 07/09/2015   Status Achieved               Plan - 06/18/15 1739    Clinical Impression Statement Pt has met 1/1 LTG's. Pt ready for d/c from OT. Pt in agreement   Rehab Potential Good   Clinical Impairments Affecting Rehab Potential pt has h/o of shoulder surgery bilaterally so will need to develop HEP to address strength without inflamming shoulder.    OT Frequency Other (comment)   OT Duration 4 weeks   OT Treatment/Interventions Therapeutic exercise;Patient/family education;Therapeutic activities   Plan d/c from OT  Consulted and Agree with Plan of Care Patient      Patient will benefit from skilled therapeutic intervention in order to improve the following deficits and impairments:  Decreased strength, Impaired sensation  Visit Diagnosis: Muscle weakness (generalized)      G-Codes - Jul 04, 2015 1740    Functional Assessment Tool Used MMT   Functional Limitation Self care   Self Care Current Status (Q2411) 0 percent impaired, limited or restricted   Self Care Goal Status (O6431) 0 percent impaired, limited or restricted      Problem List Patient Active Problem List   Diagnosis Date Noted  . Cerebrovascular accident (CVA) due to occlusion of cerebral artery ([facility])   . Stroke ([facility]) 04/12/2015  . CVA (cerebral infarction) 04/12/2015  . Type 2 diabetes mellitus ([facility]) 04/12/2015  . History of hyperthyroidism 04/12/2015  . Elbow fracture, right 03/01/2012  . Dysplastic nevus of trunk 03/01/2012  . Urticaria due to food allergy  10/06/2011  . CONTACT DERMATITIS&OTHER ECZEMA DUE TO PLANTS 12/28/2009  . Hyperlipidemia 01/17/2007  . Essential hypertension 01/17/2007   OCCUPATIONAL THERAPY DISCHARGE SUMMARY  Visits from Start of Care: 3  Current functional level related to goals / functional outcomes: See above   Remaining deficits: Mild weakness in LUE   Education / Equipment: HEP  Plan: Patient agrees to discharge.  Patient goals were met. Patient is being discharged due to meeting the stated rehab goals.  ?????      Quay Burow, OTR/L 07/04/15, 5:41 PM  [facility] [address] [place], [place], [address] Phone: [phone]   Fax:  [phone]  Name: Amanda Conway MRN: [id number] Date of Birth: [birth year]

## 2015-06-23 ENCOUNTER — Ambulatory Visit: Payer: Medicare Other | Attending: Internal Medicine | Admitting: Physical Therapy

## 2015-06-23 ENCOUNTER — Encounter: Payer: Self-pay | Admitting: Physical Therapy

## 2015-06-23 DIAGNOSIS — M6281 Muscle weakness (generalized): Secondary | ICD-10-CM | POA: Insufficient documentation

## 2015-06-23 DIAGNOSIS — R2689 Other abnormalities of gait and mobility: Secondary | ICD-10-CM

## 2015-06-23 DIAGNOSIS — R2681 Unsteadiness on feet: Secondary | ICD-10-CM | POA: Diagnosis not present

## 2015-06-23 NOTE — Therapy (Signed)
[facility] [address] [place], [place], [address] Phone: [phone]   Fax:  [phone]  Physical Therapy Treatment  Patient Details  Name: Amanda Conway MRN: [id number] Date of Birth: 01/02/1941 Referring Provider: Jeffrey Todd, MD  Encounter Date: 06/23/2015      PT End of Session - 06/23/15 1239    Visit Number 4   Number of Visits 17   Date for PT Re-Evaluation 08/02/15   Authorization Type UHC Medicare   PT Start Time 1233   PT Stop Time 1313   PT Time Calculation (min) 40 min   Equipment Utilized During Treatment Gait belt   Activity Tolerance Patient tolerated treatment well   Behavior During Therapy WFL for tasks assessed/performed      Past Medical History  Diagnosis Date  . Diabetes mellitus   . Hypertension   . High cholesterol   . Hyperthyroidism     Past Surgical History  Procedure Laterality Date  . Bilateral rotator cuff repair      There were no vitals filed for this visit.      Subjective Assessment - 06/23/15 1238    Subjective No new complaint. No falls or pain to report today.   Limitations Standing;Walking   Patient Stated Goals She wants to improve balance and right hand & foot work better. She wants to garden again.   Currently in Pain? Yes   Pain Score 4    Pain Location Back   Pain Orientation Lower   Pain Descriptors / Indicators Aching   Pain Type Chronic pain   Pain Onset More than a month ago   Pain Frequency Intermittent   Aggravating Factors  certain movements   Pain Relieving Factors rest, avoiding certain movements            [facility] Adult PT Treatment/Exercise - 06/23/15 1240    Transfers   Transfers Sit to Stand;Stand to Sit   Sit to Stand 6: Modified independent (Device/Increase time);Without upper extremity assist;From chair/3-in-1   Stand to Sit 6: Modified independent (Device/Increase time);Without upper extremity assist;To chair/3-in-1   Ambulation/Gait   Ambulation/Gait Yes   Ambulation/Gait Assistance 5: Supervision;6: Modified independent (Device/Increase time)   Ambulation/Gait Assistance Details pt involved in cognitive task or enviormental scanning throughout entire gait trial without change in gait speed or any balance issues noted,   Ambulation Distance (Feet) 1000 Feet   Assistive device None   Gait Pattern Step-through pattern;Decreased stride length   Ambulation Surface Level;Unlevel;Indoor;Outdoor;Paved   Neuro Re-ed    Neuro Re-ed Details  dynamic gait activities: gait around track with self ball task while involved in cognitive tasks with supervision, cues to maintain gait speed/stay on task with ball toss, no balance issues noted; gait along hallways scanning for playing cards x 2 laps with only 2 cards missed each time. supervision for balance with cues to maintain gait speed.                                                                        Balance Exercises - 06/23/15 1300    Balance Exercises: Standing   Standing Eyes Closed Narrow base of support (BOS);Foam/compliant surface;Other reps (comment);Limitations;20 secs   Balance Exercises: Standing     Standing Eyes Closed Limitations blue foam mat on top of ramp: facing both up and down ramp with feet together, EC no head movements 20 sec's x 2 reps, EC with head movements up<>down and left<>right x 10 each. min guard assist to supervision for balance.                                               PT Short Term Goals - 06/23/15 1345    PT SHORT TERM GOAL #1   Title Patient demonstrates initial HEP correctly. (Target Date: 07/03/2015)   Baseline met on 06/23/15   Status Achieved   PT SHORT TERM GOAL #2   Title Functional Gait Assessment >/= 14/30 (Target Date: 07/03/2015)   Status On-going   PT SHORT TERM GOAL #3   Title Patient ambulates 500' without device including ramps & curbs with supervision. (Target Date: 07/03/2015)   Baseline met on 06/23/15    Status Achieved           PT Long Term Goals - 06/03/15 0930    PT LONG TERM GOAL #1   Title Patient verbalizes understanding of ongoing HEP, fitness plan. (Target Date: 07/31/2015)   Time 8   Period Weeks   Status New   PT LONG TERM GOAL #2   Title Berg Balance Test > 52/56 to indicate lower fall risk. (Target Date: 07/31/2015)   Time 8   Period Weeks   Status New   PT LONG TERM GOAL #3   Title Functional Gait Assessment >/= 19/30 to indicate lower fall risk. (Target Date: 07/31/2015)   Time 8   Period Weeks   Status New   PT LONG TERM GOAL #4   Title Patient ambulates 1000' including grass, ramps, curbs, stairs (1 rail) reciprocal without device independently to enable safe community access. (Target Date: 07/31/2015)   Time 8   Period Weeks   Status New   PT LONG TERM GOAL #5   Title Patient demonstrates tasks to enable return to gardening modified independent safely. (Target Date: 07/31/2015)   Time 8   Period Weeks   Status New           Plan - 06/23/15 1239    Clinical Impression Statement Pt has met some STGs and has made significant progress toward all unmet goals (including LTGs). Pt expressing concerns with copays and wondering how much longer she needs to attend PT (has been discharged for OT). Will disucss with PT and if agreeable being checking LTGs next session for possible early discharge.                          Rehab Potential Good   PT Frequency 2x / week   PT Duration 8 weeks   PT Treatment/Interventions ADLs/Self Care Home Management;Gait training;Stair training;Functional mobility training;Therapeutic activities;Therapeutic exercise;Balance training;Neuromuscular re-education;Patient/family education   PT Next Visit Plan begin checking goals to assess progress for possible early discharge.   Consulted and Agree with Plan of Care Patient      Patient will benefit from skilled therapeutic intervention in order to improve the following deficits and  impairments:  Abnormal gait, Decreased activity tolerance, Decreased balance, Decreased endurance, Decreased mobility, Decreased strength  Visit Diagnosis: Muscle weakness (generalized)  Other abnormalities of gait and mobility  Unsteadiness on feet     Problem  List Patient Active Problem List   Diagnosis Date Noted  . Cerebrovascular accident (CVA) due to occlusion of cerebral artery ([facility])   . Stroke ([facility]) 04/12/2015  . CVA (cerebral infarction) 04/12/2015  . Type 2 diabetes mellitus ([facility]) 04/12/2015  . History of hyperthyroidism 04/12/2015  . Elbow fracture, right 03/01/2012  . Dysplastic nevus of trunk 03/01/2012  . 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 CONTACT DERMATITIS&OTHER ECZEMA DUE TO PLANTS 12/28/2009  . Hyperlipidemia 01/17/2007  . Essential hypertension 01/17/2007    Kathy Bury, PTA, CLT [facility] [address], [address] [place], [place] [address] [phone] 06/23/2015, 1:48 PM   Name: Amanda Conway MRN: [id number] Date of Birth: 06/07/1940     

## 2015-06-26 ENCOUNTER — Ambulatory Visit: Payer: Medicare Other | Admitting: Physical Therapy

## 2015-06-26 ENCOUNTER — Encounter: Payer: Self-pay | Admitting: Physical Therapy

## 2015-06-26 VITALS — BP 132/66

## 2015-06-26 DIAGNOSIS — R2689 Other abnormalities of gait and mobility: Secondary | ICD-10-CM | POA: Diagnosis not present

## 2015-06-26 DIAGNOSIS — M6281 Muscle weakness (generalized): Secondary | ICD-10-CM

## 2015-06-26 DIAGNOSIS — R2681 Unsteadiness on feet: Secondary | ICD-10-CM | POA: Diagnosis not present

## 2015-06-26 NOTE — Therapy (Signed)
[facility] [address] [place], [place], [address] Phone: [phone]   Fax:  [phone]  Physical Therapy Treatment  Patient Details  Name: ROKIA BOSKET MRN: [id number] Date of Birth: 01-24-1941 Referring Provider: Stevie Kern, MD  Encounter Date: 06/26/2015      PT End of Session - 06/26/15 1452    Visit Number 5   Number of Visits 17   Date for PT Re-Evaluation 08/02/15   Authorization Type UHC Medicare   PT Start Time 1448   PT Stop Time 1530   PT Time Calculation (min) 42 min   Equipment Utilized During Treatment Gait belt   Activity Tolerance Patient tolerated treatment well   Behavior During Therapy [facility] for tasks assessed/performed      Past Medical History  Diagnosis Date  . Diabetes mellitus   . Hypertension   . High cholesterol   . Hyperthyroidism     Past Surgical History  Procedure Laterality Date  . Bilateral rotator cuff repair      Filed Vitals:   06/26/15 1449  BP: 132/66          [facility] PT Assessment - 06/26/15 1453    Functional Gait  Assessment   Gait assessed  Yes   Gait Level Surface Walks 20 ft in less than 5.5 sec, no assistive devices, good speed, no evidence for imbalance, normal gait pattern, deviates no more than 6 in outside of the 12 in walkway width.   Change in Gait Speed Able to smoothly change walking speed without loss of balance or gait deviation. Deviate no more than 6 in outside of the 12 in walkway width.   Gait with Horizontal Head Turns Performs head turns smoothly with no change in gait. Deviates no more than 6 in outside 12 in walkway width   Gait with Vertical Head Turns Performs head turns with no change in gait. Deviates no more than 6 in outside 12 in walkway width.   Gait and Pivot Turn Pivot turns safely in greater than 3 sec and stops with no loss of balance, or pivot turns safely within 3 sec and stops with mild imbalance, requires small steps  to catch balance.   Step Over Obstacle Is able to step over 2 stacked shoe boxes taped together (9 in total height) without changing gait speed. No evidence of imbalance.   Gait with Narrow Base of Support Is able to ambulate for 10 steps heel to toe with no staggering.   Gait with Eyes Closed Walks 20 ft, uses assistive device, slower speed, mild gait deviations, deviates 6-10 in outside 12 in walkway width. Ambulates 20 ft in less than 9 sec but greater than 7 sec.   Ambulating Backwards Walks 20 ft, no assistive devices, good speed, no evidence for imbalance, normal gait   Steps Alternating feet, must use rail.   Total Score 27            [facility] Adult PT Treatment/Exercise - 06/26/15 1453    Ambulation/Gait   Ambulation/Gait Yes   Ambulation/Gait Assistance 6: Modified independent (Device/Increase time)   Ambulation Distance (Feet) 1000 Feet   Assistive device None   Gait Pattern Within Functional Limits   Ambulation Surface Unlevel;Level;Indoor;Outdoor;Paved;Gravel;Grass   Stairs Yes   Stairs Assistance 6: Modified independent (Device/Increase time)   Stair Management Technique Two rails;Alternating pattern;Forwards   Number of Stairs 4   Ramp 6: Modified independent (Device)  outdoor ramps   Curb 6: Modified independent (  Device/increase time)  outdoor curb   Furniture conservator/restorer   Sit to Stand Able to stand without using hands and stabilize independently   Standing Unsupported Able to stand safely 2 minutes   Sitting with Back Unsupported but Feet Supported on Floor or Stool Able to sit safely and securely 2 minutes   Stand to Sit Sits safely with minimal use of hands   Transfers Able to transfer safely, minor use of hands   Standing Unsupported with Eyes Closed Able to stand 10 seconds safely   Standing Ubsupported with Feet Together Able to place feet together independently and stand 1 minute safely   From Standing, Reach Forward with Outstretched Arm Can reach confidently >25  cm (10")   From Standing Position, Pick up Object from Floor Able to pick up shoe safely and easily   From Standing Position, Turn to Look Behind Over each Shoulder Looks behind one side only/other side shows less weight shift  right > left   Turn 360 Degrees Able to turn 360 degrees safely in 4 seconds or less   Standing Unsupported, Alternately Place Feet on Step/Stool Able to stand independently and safely and complete 8 steps in 20 seconds  15.37 sec's   Standing Unsupported, One Foot in Front Able to place foot tandem independently and hold 30 seconds   Standing on One Leg Able to lift leg independently and hold 5-10 seconds   Total Score 54             PT Short Term Goals - 06/26/15 1530    PT SHORT TERM GOAL #1   Title Patient demonstrates initial HEP correctly. (Target Date: 07/03/2015)   Baseline met on 06/23/15   Status Achieved   PT SHORT TERM GOAL #2   Title Functional Gait Assessment >/= 14/30 (Target Date: 07/03/2015)   Baseline met on 06/26/15- see LTG for value   Status Achieved   PT SHORT TERM GOAL #3   Title Patient ambulates 500' without device including ramps & curbs with supervision. (Target Date: 07/03/2015)   Baseline met on 06/23/15   Status Achieved           PT Long Term Goals - 06/26/15 1513    PT LONG TERM GOAL #1   Title Patient verbalizes understanding of ongoing HEP, fitness plan. (Target Date: 07/31/2015)   Baseline met on 06/26/15   Status Achieved   PT LONG TERM GOAL #2   Title Berg Balance Test > 52/56 to indicate lower fall risk. (Target Date: 07/31/2015)   Baseline 06/26/15: 54/56 scored today   Time --   Period --   Status Achieved   PT LONG TERM GOAL #3   Title Functional Gait Assessment >/= 19/30 to indicate lower fall risk. (Target Date: 07/31/2015)   Baseline 06/26/15: 27/30 today   Status Achieved   PT LONG TERM GOAL #4   Title Patient ambulates 1000' including grass, ramps, curbs, stairs (1 rail) reciprocal without device independently to  enable safe community access. (Target Date: 07/31/2015)   Baseline met on 06/26/15   Status Achieved   PT LONG TERM GOAL #5   Title Patient demonstrates tasks to enable return to gardening modified independent safely. (Target Date: 07/31/2015)   Baseline 06/26/15: pt reports back gardening without any issues   Status Achieved            Plan - 06/26/15 1452    Clinical Impression Statement Pt has met all STGs and LTGs. Pt and primary PT  agreeable to discharge today (earlier than plan of care) with goals met. All pt. questions addressed: use of massager she bought, which ball OT had her using and where to find one, substitutions for 1# hand weights)>   Rehab Potential Good   PT Frequency 2x / week   PT Duration 8 weeks   PT Treatment/Interventions ADLs/Self Care Home Management;Gait training;Stair training;Functional mobility training;Therapeutic activities;Therapeutic exercise;Balance training;Neuromuscular re-education;Patient/family education   PT Next Visit Plan discharge this visit.   Consulted and Agree with Plan of Care Patient      Patient will benefit from skilled therapeutic intervention in order to improve the following deficits and impairments:  Abnormal gait, Decreased activity tolerance, Decreased balance, Decreased endurance, Decreased mobility, Decreased strength  Visit Diagnosis: Muscle weakness (generalized)       G-Codes - Jun 27, 2015 1530    Functional Assessment Tool Used functional gait assessment 27/30       Problem List Patient Active Problem List   Diagnosis Date Noted  . Cerebrovascular accident (CVA) due to occlusion of cerebral artery ([facility])   . Stroke ([facility]) 04/12/2015  . CVA (cerebral infarction) 04/12/2015  . Type 2 diabetes mellitus ([facility]) 04/12/2015  . History of hyperthyroidism 04/12/2015  . Elbow fracture, right 03/01/2012  . Dysplastic nevus of trunk 03/01/2012  . 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 CONTACT DERMATITIS&OTHER ECZEMA DUE TO  PLANTS 12/28/2009  . Hyperlipidemia 01/17/2007  . Essential hypertension 01/17/2007    Willow Ora, PTA, [facility] [address] [place], [place] [address] [phone] 2015/06/27, 3:55 PM   Name: TIYAH ZELENAK MRN: [id number] Date of Birth: 10-10-1940

## 2015-06-30 ENCOUNTER — Ambulatory Visit: Payer: Medicare Other | Admitting: Physical Therapy

## 2015-07-02 ENCOUNTER — Ambulatory Visit: Payer: Medicare Other | Admitting: Physical Therapy

## 2015-07-07 ENCOUNTER — Ambulatory Visit: Payer: Medicare Other | Admitting: Physical Therapy

## 2015-07-09 ENCOUNTER — Ambulatory Visit: Payer: Medicare Other | Admitting: Physical Therapy

## 2015-07-14 ENCOUNTER — Ambulatory Visit: Payer: Medicare Other | Admitting: Physical Therapy

## 2015-07-14 DIAGNOSIS — H2513 Age-related nuclear cataract, bilateral: Secondary | ICD-10-CM | POA: Diagnosis not present

## 2015-07-14 DIAGNOSIS — E119 Type 2 diabetes mellitus without complications: Secondary | ICD-10-CM | POA: Diagnosis not present

## 2015-07-14 DIAGNOSIS — H25013 Cortical age-related cataract, bilateral: Secondary | ICD-10-CM | POA: Diagnosis not present

## 2015-07-14 DIAGNOSIS — Z0100 Encounter for examination of eyes and vision without abnormal findings: Secondary | ICD-10-CM | POA: Diagnosis not present

## 2015-07-16 ENCOUNTER — Ambulatory Visit: Payer: Medicare Other | Admitting: Physical Therapy

## 2015-07-21 ENCOUNTER — Ambulatory Visit: Payer: Medicare Other | Admitting: Physical Therapy

## 2015-07-21 ENCOUNTER — Other Ambulatory Visit: Payer: Self-pay | Admitting: *Deleted

## 2015-07-21 MED ORDER — AMLODIPINE BESYLATE 5 MG PO TABS: 5.0000 mg | ORAL_TABLET | Freq: Every day | ORAL | Status: DC

## 2015-07-21 NOTE — Telephone Encounter (Signed)
Pharmacy faxed request since the pt will not get her mail order Rx until 6/1.  Rx done.

## 2015-07-23 ENCOUNTER — Ambulatory Visit: Payer: Medicare Other | Admitting: Physical Therapy

## 2015-07-28 ENCOUNTER — Ambulatory Visit: Payer: Medicare Other | Admitting: Physical Therapy

## 2015-07-30 ENCOUNTER — Ambulatory Visit: Payer: Medicare Other | Admitting: Physical Therapy

## 2015-08-04 ENCOUNTER — Ambulatory Visit: Payer: Medicare Other | Admitting: Physical Therapy

## 2015-08-06 ENCOUNTER — Ambulatory Visit: Payer: Medicare Other | Admitting: Physical Therapy

## 2015-08-31 ENCOUNTER — Other Ambulatory Visit: Payer: Self-pay | Admitting: Family Medicine

## 2015-09-01 NOTE — Telephone Encounter (Signed)
Rx refill sent to pharmacy. 

## 2015-11-04 ENCOUNTER — Other Ambulatory Visit: Payer: Self-pay | Admitting: Emergency Medicine

## 2015-11-04 MED ORDER — BENAZEPRIL HCL 20 MG PO TABS: 20.0000 mg | ORAL_TABLET | Freq: Every day | ORAL | 3 refills | Status: DC

## 2015-11-04 MED ORDER — AMLODIPINE BESYLATE 5 MG PO TABS: 5.0000 mg | ORAL_TABLET | Freq: Every day | ORAL | 0 refills | Status: DC

## 2015-12-16 ENCOUNTER — Other Ambulatory Visit: Payer: Self-pay | Admitting: Family Medicine

## 2015-12-16 DIAGNOSIS — Z1231 Encounter for screening mammogram for malignant neoplasm of breast: Secondary | ICD-10-CM

## 2015-12-22 ENCOUNTER — Ambulatory Visit
Admission: RE | Admit: 2015-12-22 | Discharge: 2015-12-22 | Disposition: A | Discharge status: Home / Self Care | Payer: Medicare Other | Source: Ambulatory Visit | Attending: Family Medicine | Admitting: Family Medicine

## 2015-12-22 DIAGNOSIS — Z1231 Encounter for screening mammogram for malignant neoplasm of breast: Secondary | ICD-10-CM | POA: Diagnosis not present

## 2016-02-17 ENCOUNTER — Other Ambulatory Visit: Payer: Self-pay | Admitting: Family Medicine

## 2016-03-28 ENCOUNTER — Other Ambulatory Visit: Payer: Self-pay | Admitting: Emergency Medicine

## 2016-03-28 MED ORDER — BENAZEPRIL HCL 20 MG PO TABS: 20.0000 mg | ORAL_TABLET | Freq: Every day | ORAL | 2 refills | Status: DC

## 2016-06-01 ENCOUNTER — Encounter: Payer: Self-pay | Admitting: Adult Health

## 2016-06-01 ENCOUNTER — Telehealth: Payer: Self-pay | Admitting: Family Medicine

## 2016-06-01 ENCOUNTER — Ambulatory Visit (INDEPENDENT_AMBULATORY_CARE_PROVIDER_SITE_OTHER): Payer: Medicare Other | Admitting: Adult Health

## 2016-06-01 VITALS — BP 124/68 | Ht 60.0 in | Wt 146.3 lb

## 2016-06-01 DIAGNOSIS — R198 Other specified symptoms and signs involving the digestive system and abdomen: Secondary | ICD-10-CM

## 2016-06-01 DIAGNOSIS — E7800 Pure hypercholesterolemia, unspecified: Secondary | ICD-10-CM

## 2016-06-01 LAB — COMPREHENSIVE METABOLIC PANEL
ALBUMIN: 4 g/dL (ref 3.5–5.2)
ALK PHOS: 109 U/L (ref 39–117)
ALT: 9 U/L (ref 0–35)
AST: 10 U/L (ref 0–37)
BUN: 16 mg/dL (ref 6–23)
CHLORIDE: 102 meq/L (ref 96–112)
CO2: 26 mEq/L (ref 19–32)
CREATININE: 0.72 mg/dL (ref 0.40–1.20)
Calcium: 9.4 mg/dL (ref 8.4–10.5)
GFR: 101.43 mL/min (ref >60.00)
Glucose, Bld: 102 mg/dL — ABNORMAL HIGH (ref 70–99)
POTASSIUM: 4 meq/L (ref 3.5–5.1)
SODIUM: 138 meq/L (ref 135–145)
TOTAL PROTEIN: 6.4 g/dL (ref 6.0–8.3)
Total Bilirubin: 0.4 mg/dL (ref 0.2–1.2)

## 2016-06-01 LAB — H. PYLORI ANTIBODY, IGG: H Pylori IgG: NEGATIVE

## 2016-06-01 LAB — CBC WITH DIFFERENTIAL/PLATELET
Basophils Absolute: 0 10*3/uL (ref 0.0–0.1)
Basophils Relative: 0.6 % (ref 0.0–3.0)
EOS ABS: 0.3 10*3/uL (ref 0.0–0.7)
EOS PCT: 4.2 % (ref 0.0–5.0)
HCT: 37.9 % (ref 36.0–46.0)
HEMOGLOBIN: 12.8 g/dL (ref 12.0–15.0)
LYMPHS ABS: 1.9 10*3/uL (ref 0.7–4.0)
Lymphocytes Relative: 28.7 % (ref 12.0–46.0)
MCHC: 33.8 g/dL (ref 30.0–36.0)
MCV: 108.7 fl — ABNORMAL HIGH (ref 78.0–100.0)
MONO ABS: 0.5 10*3/uL (ref 0.1–1.0)
Monocytes Relative: 7.5 % (ref 3.0–12.0)
NEUTROS PCT: 59 % (ref 43.0–77.0)
Neutro Abs: 3.9 10*3/uL (ref 1.4–7.7)
Platelets: 380 10*3/uL (ref 150.0–400.0)
RBC: 3.48 Mil/uL — AB (ref 3.87–5.11)
RDW: 16 % — ABNORMAL HIGH (ref 11.5–15.5)
WBC: 6.5 10*3/uL (ref 4.0–10.5)

## 2016-06-01 LAB — LIPASE: Lipase: 25 U/L (ref 11.0–59.0)

## 2016-06-01 MED ORDER — GLIPIZIDE 10 MG PO TABS: ORAL_TABLET | ORAL | 0 refills | Status: DC

## 2016-06-01 MED ORDER — BENAZEPRIL HCL 20 MG PO TABS: 20.0000 mg | ORAL_TABLET | Freq: Every day | ORAL | 2 refills | Status: AC

## 2016-06-01 MED ORDER — AMLODIPINE BESYLATE 5 MG PO TABS: 5.0000 mg | ORAL_TABLET | Freq: Every day | ORAL | 0 refills | Status: DC

## 2016-06-01 MED ORDER — PRAVASTATIN SODIUM 10 MG PO TABS: ORAL_TABLET | ORAL | 3 refills | Status: DC

## 2016-06-01 NOTE — Progress Notes (Signed)
Subjective:    Patient ID: Amanda Conway, female    DOB: 1940/09/24, 76 y.o.   MRN: [id number]  HPI  76 year old female who  has a past medical history of Diabetes mellitus; High cholesterol; Hypertension; and Hyperthyroidism. She is a patient of Dr. Sherren Mocha who presents to the office today for an acute issue   She is s/p stroke in February of 2017 and has had limited follow up care since. She reports today that her left abdomen has felt " full" over the last three months. The feeling of fullness has become worse over the course of the three months. Denies any " pain" but has discomfort.   Has not had any nausea, vomiting, or diarrhea   Review of Systems See HPI   Past Medical History:  Diagnosis Date  . Diabetes mellitus   . High cholesterol   . Hypertension   . Hyperthyroidism     Social History   Social History  . Marital status: Single    Spouse name: N/A  . Number of children: N/A  . Years of education: N/A   Occupational History  . Not on file.   Social History Main Topics  . Smoking status: Former Smoker    Types: Cigarettes    Quit date: 02/28/1971  . Smokeless tobacco: Never Used  . Alcohol use No  . Drug use: No  . Sexual activity: No   Other Topics Concern  . Not on file   Social History Narrative  . No narrative on file    Past Surgical History:  Procedure Laterality Date  . bilateral rotator cuff repair      Family History  Problem Relation Age of Onset  . Diabetes Mother   . Cancer Father     Allergies  Allergen Reactions  . Banana     Lungs collapsed   . Corn-Containing Products     Tongue swells, pt's tolerated recently  . Fish Allergy Itching    In large amounts  . Sulfonamide Derivatives     REACTION: family hx    Current Outpatient Prescriptions on File Prior to Visit  Medication Sig Dispense Refill  . ACCU-CHEK FASTCLIX LANCETS MISC Dx E11.9 test once daily 30 each 12  . amLODipine (NORVASC) 5 MG tablet Take 1 tablet (5  mg total) by mouth daily. 90 tablet 0  . aspirin EC 325 MG EC tablet Take 1 tablet (325 mg total) by mouth daily. 30 tablet 0  . benazepril (LOTENSIN) 20 MG tablet Take 1 tablet (20 mg total) by mouth daily. 90 tablet 2  . EPINEPHrine (EPI-PEN) 0.3 mg/0.3 mL DEVI Inject 0.3 mLs (0.3 mg total) into the muscle once. (Patient not taking: Reported on 06/03/2015) 1 Device 2  . glipiZIDE (GLUCOTROL) 10 MG tablet TAKE ONE TABLET BY MOUTH TWICE DAILY BEFORE  A  MEAL 180 tablet 0  . glucose blood (ACCU-CHEK AVIVA) test strip Test once daily, Dx E11.9 30 each 12  . metFORMIN (GLUCOPHAGE) 500 MG tablet Take 1 tablet (500 mg total) by mouth 2 (two) times daily with a meal. (Patient not taking: Reported on 06/03/2015) 60 tablet 3  . Multiple Vitamins-Minerals (MULTIVITAL PO) Take by mouth.    . Nutritional Supplements (JOINT FORMULA PO) Take by mouth. Joint juice    . Omega-3 Fatty Acids (FISH OIL) 1200 MG CPDR Take 1 capsule by mouth 3 (three) times a week.     . pravastatin (PRAVACHOL) 10 MG tablet 1 tablet daily 100  tablet 3   No current facility-administered medications on file prior to visit.     BP 124/68 (BP Location: Left Arm, Patient Position: Sitting, Cuff Size: Normal)   Ht 5' (1.524 m)   Wt 146 lb 4.8 oz (66.4 kg)   BMI 28.57 kg/m       Objective:   Physical Exam  Constitutional: She is oriented to person, place, and time. She appears well-developed and well-nourished. No distress.  Cardiovascular: Normal rate, regular rhythm, normal heart sounds and intact distal pulses.  Exam reveals no gallop and no friction rub.   No murmur heard. Pulmonary/Chest: Effort normal and breath sounds normal. No respiratory distress. She has no wheezes. She has no rales. She exhibits no tenderness.  Abdominal: Normal appearance and bowel sounds are normal. She exhibits no distension. There is no hepatosplenomegaly, splenomegaly or hepatomegaly. There is tenderness in the right upper quadrant and epigastric  area. There is no rebound, no guarding and no CVA tenderness. No hernia.  Neurological: She is alert and oriented to person, place, and time.  Skin: Skin is warm and dry. No rash noted. She is not diaphoretic. No erythema. No pallor.  Psychiatric: She has a normal mood and affect. Her behavior is normal. Judgment and thought content normal.  Nursing note and vitals reviewed.     Assessment & Plan:  1. RUQ fullness - Unsure of what is causing this feeling of " fullness".  - CBC with Differential/Platelet - Comprehensive metabolic panel - H. pylori antibody, IgG - Lipase - US Abdomen Limited RUQ; Future - Will consider CT in the future.  - She is going to follow up to establish care with me in the future   Dorothyann Peng, NP

## 2016-06-01 NOTE — Addendum Note (Signed)
Addended by: Sandria Bales B on: 06/01/2016 02:31 PM   Modules accepted: Orders

## 2016-06-01 NOTE — Telephone Encounter (Signed)
° ° ° °  Pt request refill of the following:  amLODipine (NORVASC) 5 MG tablet   Phamacy:  [facility]

## 2016-06-01 NOTE — Addendum Note (Signed)
Addended by: Sandria Bales B on: 06/01/2016 02:23 PM   Modules accepted: Orders

## 2016-06-01 NOTE — Patient Instructions (Addendum)
Please stop by the front desk and follow up with me establish care   I will follow up with you regarding your blood work and ultrasound   See you soon!

## 2016-06-01 NOTE — Telephone Encounter (Signed)
Rx has been refilled to preferred pharmacy.

## 2016-06-08 ENCOUNTER — Encounter: Payer: Self-pay | Admitting: Adult Health

## 2016-06-08 ENCOUNTER — Ambulatory Visit (INDEPENDENT_AMBULATORY_CARE_PROVIDER_SITE_OTHER): Payer: Medicare Other | Admitting: Adult Health

## 2016-06-08 VITALS — BP 126/74 | Temp 98.2°F | Ht 60.0 in | Wt 146.8 lb

## 2016-06-08 DIAGNOSIS — Z7689 Persons encountering health services in other specified circumstances: Secondary | ICD-10-CM

## 2016-06-08 DIAGNOSIS — I10 Essential (primary) hypertension: Secondary | ICD-10-CM

## 2016-06-08 DIAGNOSIS — E118 Type 2 diabetes mellitus with unspecified complications: Secondary | ICD-10-CM | POA: Diagnosis not present

## 2016-06-08 LAB — POCT GLYCOSYLATED HEMOGLOBIN (HGB A1C): Hemoglobin A1C: 8

## 2016-06-08 MED ORDER — ONETOUCH ULTRASOFT LANCETS MISC: 12 refills | Status: AC

## 2016-06-08 MED ORDER — GLUCOSE BLOOD VI STRP: ORAL_STRIP | 12 refills | Status: AC

## 2016-06-08 NOTE — Progress Notes (Signed)
Patient presents to clinic today to establish care. She is a pleasant 76 year old female who  has a past medical history of CVA (cerebral vascular accident) ([place]); Diabetes mellitus; High cholesterol; Hypertension; and Hyperthyroidism.  She is a former patient of Dr. Sherren Mocha  Acute Concerns: Establish Care    Chronic Issues:  Diabetes - She takes glipizide 10 mg tablet. Was unable to tolerate Metformin due to GI upset. She reports not checking her blood sugars at home. She does not follow a diabetic diet.  Lab Results  Component Value Date   HGBA1C 9.5 (H) 03/19/2015     Hyperlipidemia - She does not use pravastatin due to leg cramps  She does use fish oil   Hypertension - Norvasc 5 mg and Lotensin 20 mg. Her blood pressure is controlled.   S/p CVA in February 2017 - denies any residual issues. She was started on ASA 325 mg and statin. She continues to take ASA but reports unable to take statin due to myalgias.   Health Maintenance: Dental -- Does not do routine care - She wears dentures  Vision -- Routine  Immunizations -- UTD  Colonoscopy --2010  Mammogram --11/2015  PAP -- No longer needs  Bone Density -- Never had one    Past Medical History:  Diagnosis Date  . CVA (cerebral vascular accident) ([place])   . Diabetes mellitus   . High cholesterol   . Hypertension   . Hyperthyroidism     Past Surgical History:  Procedure Laterality Date  . bilateral rotator cuff repair      Current Outpatient Prescriptions on File Prior to Visit  Medication Sig Dispense Refill  . ACCU-CHEK FASTCLIX LANCETS MISC Dx E11.9 test once daily 30 each 12  . amLODipine (NORVASC) 5 MG tablet Take 1 tablet (5 mg total) by mouth daily. 90 tablet 0  . aspirin EC 325 MG EC tablet Take 1 tablet (325 mg total) by mouth daily. 30 tablet 0  . benazepril (LOTENSIN) 20 MG tablet Take 1 tablet (20 mg total) by mouth daily. 90 tablet 2  . EPINEPHrine (EPI-PEN) 0.3 mg/0.3 mL DEVI Inject 0.3 mLs (0.3 mg  total) into the muscle once. 1 Device 2  . glipiZIDE (GLUCOTROL) 10 MG tablet TAKE ONE TABLET BY MOUTH TWICE DAILY BEFORE  A  MEAL 180 tablet 0  . glucose blood (ACCU-CHEK AVIVA) test strip Test once daily, Dx E11.9 30 each 12  . Multiple Vitamins-Minerals (MULTIVITAL PO) Take by mouth.    . Nutritional Supplements (JOINT FORMULA PO) Take by mouth. Joint juice    . Omega-3 Fatty Acids (FISH OIL) 1200 MG CPDR Take 1 capsule by mouth 3 (three) times a week.     . pravastatin (PRAVACHOL) 10 MG tablet 1 tablet daily 100 tablet 3  . metFORMIN (GLUCOPHAGE) 500 MG tablet Take 1 tablet (500 mg total) by mouth 2 (two) times daily with a meal. (Patient not taking: Reported on 06/03/2015) 60 tablet 3   No current facility-administered medications on file prior to visit.     Allergies  Allergen Reactions  . Banana     Lungs collapsed   . Corn-Containing Products     Tongue swells, pt's tolerated recently  . Fish Allergy Itching    In large amounts  . Sulfonamide Derivatives     REACTION: family hx    Family History  Problem Relation Age of Onset  . Diabetes Mother   . Cancer Father     Social  History   Social History  . Marital status: Single    Spouse name: N/A  . Number of children: N/A  . Years of education: N/A   Occupational History  . Not on file.   Social History Main Topics  . Smoking status: Former Smoker    Types: Cigarettes    Quit date: 02/28/1971  . Smokeless tobacco: Never Used  . Alcohol use No  . Drug use: No  . Sexual activity: No   Other Topics Concern  . Not on file   Social History Narrative  . No narrative on file    Review of Systems  Constitutional: Negative.   HENT: Negative.   Eyes: Negative.   Respiratory: Negative.   Cardiovascular: Negative.   Gastrointestinal: Negative.   Genitourinary: Negative.   Musculoskeletal: Negative.   Skin: Negative.   Neurological: Negative.   Endo/Heme/Allergies: Negative.   Psychiatric/Behavioral:  Negative.   All other systems reviewed and are negative.   BP 126/74 (BP Location: Left Arm, Patient Position: Sitting, Cuff Size: Normal)   Temp 98.2 F (36.8 C) (Oral)   Ht 5' (1.524 m)   Wt 146 lb 12.8 oz (66.6 kg)   BMI 28.67 kg/m   Physical Exam  Constitutional: She is oriented to person, place, and time and well-developed, well-nourished, and in no distress. No distress.  HENT:  Head: Normocephalic and atraumatic.  Right Ear: External ear normal.  Left Ear: External ear normal.  Nose: Nose normal.  Mouth/Throat: Oropharynx is clear and moist. No oropharyngeal exudate.  Eyes: Conjunctivae and EOM are normal. Pupils are equal, round, and reactive to light. Right eye exhibits no discharge. Left eye exhibits no discharge. No scleral icterus.  Cardiovascular: Normal rate, regular rhythm, normal heart sounds and intact distal pulses.  Exam reveals no gallop and no friction rub.   No murmur heard. Pulmonary/Chest: Effort normal and breath sounds normal. No respiratory distress. She has no wheezes. She has no rales. She exhibits no tenderness.  Musculoskeletal: Normal range of motion. She exhibits no edema, tenderness or deformity.  Neurological: She is alert and oriented to person, place, and time. She has normal reflexes. She displays normal reflexes. No cranial nerve deficit. She exhibits normal muscle tone. Gait normal. Coordination normal. GCS score is 15.  Skin: Skin is warm and dry. No rash noted. She is not diaphoretic. No erythema. No pallor.  Psychiatric: Memory, affect and judgment normal.  Nursing note and vitals reviewed.   Recent Results (from the past 2160 hour(s))  CBC with Differential/Platelet     Status: Abnormal   Collection Time: 06/01/16  2:30 PM  Result Value Ref Range   WBC 6.5 4.0 - 10.5 K/uL   RBC 3.48 (L) 3.87 - 5.11 Mil/uL   Hemoglobin 12.8 12.0 - 15.0 g/dL   HCT 37.9 36.0 - 46.0 %   MCV 108.7 (H) 78.0 - 100.0 fl   MCHC 33.8 30.0 - 36.0 g/dL   RDW  16.0 (H) 11.5 - 15.5 %   Platelets 380.0 150.0 - 400.0 K/uL   Neutrophils Relative % 59.0 43.0 - 77.0 %   Lymphocytes Relative 28.7 12.0 - 46.0 %   Monocytes Relative 7.5 3.0 - 12.0 %   Eosinophils Relative 4.2 0.0 - 5.0 %   Basophils Relative 0.6 0.0 - 3.0 %   Neutro Abs 3.9 1.4 - 7.7 K/uL   Lymphs Abs 1.9 0.7 - 4.0 K/uL   Monocytes Absolute 0.5 0.1 - 1.0 K/uL   Eosinophils Absolute 0.3 0.0 -  0.7 K/uL   Basophils Absolute 0.0 0.0 - 0.1 K/uL  Comprehensive metabolic panel     Status: Abnormal   Collection Time: 06/01/16  2:30 PM  Result Value Ref Range   Sodium 138 135 - 145 mEq/L   Potassium 4.0 3.5 - 5.1 mEq/L   Chloride 102 96 - 112 mEq/L   CO2 26 19 - 32 mEq/L   Glucose, Bld 102 (H) 70 - 99 mg/dL   BUN 16 6 - 23 mg/dL   Creatinine, Ser 0.72 0.40 - 1.20 mg/dL   Total Bilirubin 0.4 0.2 - 1.2 mg/dL   Alkaline Phosphatase 109 39 - 117 U/L   AST 10 0 - 37 U/L   ALT 9 0 - 35 U/L   Total Protein 6.4 6.0 - 8.3 g/dL   Albumin 4.0 3.5 - 5.2 g/dL   Calcium 9.4 8.4 - 10.5 mg/dL   GFR 101.43 >60.00 mL/min  H. pylori antibody, IgG     Status: None   Collection Time: 06/01/16  2:30 PM  Result Value Ref Range   H Pylori IgG Negative Negative  Lipase     Status: None   Collection Time: 06/01/16  2:30 PM  Result Value Ref Range   Lipase 25.0 11.0 - 59.0 U/L    Assessment/Plan: 1. Encounter to establish care - Follow up for CPE  - Follow up sooner with any acute issues   2. Type 2 diabetes mellitus with complication, without long-term current use of insulin (HCC)  - POC HgB A1c- 8.0 - She would like to work on diet before going on more medication.  - Educated on the importance of diet and exercise to help control blood sugars.  - New glucometer given. Test strips and lancets ordered.   3. Essential hypertension - Well controlled   Dorothyann Peng, NP

## 2016-06-08 NOTE — Patient Instructions (Signed)
It was great seeing you today   Your A1c is 8.0. This is down from 9.5 in January.   Please work on diet, exercise and checking blood sugars at home. I would like you to check them twice a day   Follow up with me for your physical this summer

## 2016-06-28 ENCOUNTER — Encounter: Payer: Medicare Other | Admitting: Adult Health

## 2016-07-05 ENCOUNTER — Ambulatory Visit
Admission: RE | Admit: 2016-07-05 | Discharge: 2016-07-05 | Disposition: A | Discharge status: Home / Self Care | Payer: Medicare Other | Source: Ambulatory Visit | Attending: Adult Health | Admitting: Adult Health

## 2016-07-05 DIAGNOSIS — R1011 Right upper quadrant pain: Secondary | ICD-10-CM | POA: Diagnosis not present

## 2016-07-05 DIAGNOSIS — R198 Other specified symptoms and signs involving the digestive system and abdomen: Secondary | ICD-10-CM

## 2016-07-15 ENCOUNTER — Encounter: Payer: Medicare Other | Admitting: Adult Health

## 2016-07-15 NOTE — Progress Notes (Deleted)
Subjective:    Patient ID: Amanda Conway, female    DOB: 12-29-1940, 76 y.o.   MRN: [id number]  HPI Patient presents for yearly preventative medicine examination. She is a pleasant 76 year old female who  has a past medical history of CVA (cerebral vascular accident) ([place]); Diabetes mellitus; High cholesterol; Hypertension; and Hyperthyroidism.  All immunizations and health maintenance protocols were reviewed with the patient and needed orders were placed.  Appropriate screening laboratory values were ordered for the patient including screening of hyperlipidemia, renal function and hepatic function. If indicated by BPH, a PSA was ordered.  Medication reconciliation,  past medical history, social history, problem list and allergies were reviewed in detail with the patient  Goals were established with regard to weight loss, exercise, and  diet in compliance with medications  End of life planning was discussed.  Hyperlipidemia - She is unable to take statin medication due to myalgias.   Hypertension - Norvasc 5 mg and Lotensin 20 mg. Her blood pressure is controlled.   S/p CVA in February 2017 - denies any residual issues. She was started on ASA 325 mg and statin. She continues to take ASA but reports unable to take statin due to myalgias.   She is currently taking Glipizide 10 mg due to diabetes. Her last A1c in 05/2016 was 8.0  She is up to date on health maintenance items such as colonocopy and mammograms. She has had her diabetic eye exam this year    Review of Systems  Constitutional: Negative.   HENT: Negative.   Eyes: Negative.   Respiratory: Negative.   Cardiovascular: Negative.   Gastrointestinal: Negative.   Endocrine: Negative.   Genitourinary: Negative.   Musculoskeletal: Negative.   Skin: Negative.   Allergic/Immunologic: Negative.   Neurological: Negative.   Psychiatric/Behavioral: Negative.   All other systems reviewed and are negative.  Past Medical  History:  Diagnosis Date  . CVA (cerebral vascular accident) ([place])   . Diabetes mellitus   . High cholesterol   . Hypertension   . Hyperthyroidism     Social History   Social History  . Marital status: Single    Spouse name: N/A  . Number of children: N/A  . Years of education: N/A   Occupational History  . Not on file.   Social History Main Topics  . Smoking status: Former Smoker    Types: Cigarettes    Quit date: 02/28/1971  . Smokeless tobacco: Never Used  . Alcohol use No  . Drug use: No  . Sexual activity: No   Other Topics Concern  . Not on file   Social History Narrative   Retired from being a receptionist   Has three children    She enjoys gardening, Control and instrumentation engineer, and shopping     Past Surgical History:  Procedure Laterality Date  . bilateral rotator cuff repair      Family History  Problem Relation Age of Onset  . Diabetes Mother   . Mesothelioma Father     Allergies  Allergen Reactions  . Banana     Lungs collapsed   . Corn-Containing Products     Tongue swells, pt's tolerated recently  . Fish Allergy Itching    In large amounts  . Metformin And Related Diarrhea  . Statins   . Sulfonamide Derivatives     REACTION: family hx    Current Outpatient Prescriptions on File Prior to Visit  Medication Sig Dispense Refill  . amLODipine (NORVASC) 5 MG tablet  Take 1 tablet (5 mg total) by mouth daily. 90 tablet 0  . aspirin EC 325 MG EC tablet Take 1 tablet (325 mg total) by mouth daily. 30 tablet 0  . benazepril (LOTENSIN) 20 MG tablet Take 1 tablet (20 mg total) by mouth daily. 90 tablet 2  . EPINEPHrine (EPI-PEN) 0.3 mg/0.3 mL DEVI Inject 0.3 mLs (0.3 mg total) into the muscle once. 1 Device 2  . glipiZIDE (GLUCOTROL) 10 MG tablet TAKE ONE TABLET BY MOUTH TWICE DAILY BEFORE  A  MEAL 180 tablet 0  . glucose blood test strip Use as instructed 100 each 12  . Lancets (ONETOUCH ULTRASOFT) lancets Use as instructed 100 each 12  . Multiple Vitamins-Minerals  (MULTIVITAL PO) Take by mouth.    . Nutritional Supplements (JOINT FORMULA PO) Take by mouth. Joint juice    . Omega-3 Fatty Acids (FISH OIL) 1200 MG CPDR Take 1 capsule by mouth 3 (three) times a week.      No current facility-administered medications on file prior to visit.     There were no vitals taken for this visit.      Objective:   Physical Exam  Constitutional: She is oriented to person, place, and time. She appears well-developed and well-nourished. No distress.  HENT:  Head: Normocephalic and atraumatic.  Right Ear: External ear normal.  Left Ear: External ear normal.  Nose: Nose normal.  Mouth/Throat: Oropharynx is clear and moist. No oropharyngeal exudate.  Eyes: Conjunctivae and EOM are normal. Pupils are equal, round, and reactive to light. Right eye exhibits no discharge. Left eye exhibits no discharge. No scleral icterus.  Neck: Normal range of motion. Neck supple. No JVD present. No tracheal deviation present. No thyromegaly present.  Cardiovascular: Normal rate, regular rhythm, normal heart sounds and intact distal pulses.  Exam reveals no gallop and no friction rub.   No murmur heard. Pulmonary/Chest: Effort normal and breath sounds normal. No stridor. No respiratory distress. She has no wheezes. She has no rales. She exhibits no tenderness.  Abdominal: Soft. Bowel sounds are normal. She exhibits no distension and no mass. There is no tenderness. There is no rebound and no guarding.  Musculoskeletal: Normal range of motion. She exhibits no edema, tenderness or deformity.  Lymphadenopathy:    She has no cervical adenopathy.  Neurological: She is alert and oriented to person, place, and time. She displays normal reflexes. No cranial nerve deficit. She exhibits normal muscle tone. Coordination normal.  Skin: Skin is warm and dry. No rash noted. No erythema. No pallor.  Psychiatric: She has a normal mood and affect. Her behavior is normal. Judgment and thought content  normal.  Nursing note and vitals reviewed.      Assessment & Plan:

## 2016-07-19 ENCOUNTER — Telehealth: Payer: Self-pay

## 2016-07-19 NOTE — Telephone Encounter (Signed)
Unable to contact letter mailed.

## 2016-07-21 ENCOUNTER — Ambulatory Visit (INDEPENDENT_AMBULATORY_CARE_PROVIDER_SITE_OTHER): Payer: Medicare Other | Admitting: Adult Health

## 2016-07-21 ENCOUNTER — Encounter: Payer: Self-pay | Admitting: Adult Health

## 2016-07-21 VITALS — BP 126/72 | Temp 98.6°F | Ht 60.0 in | Wt 146.8 lb

## 2016-07-21 DIAGNOSIS — I10 Essential (primary) hypertension: Secondary | ICD-10-CM

## 2016-07-21 DIAGNOSIS — Z0000 Encounter for general adult medical examination without abnormal findings: Secondary | ICD-10-CM | POA: Diagnosis not present

## 2016-07-21 DIAGNOSIS — E785 Hyperlipidemia, unspecified: Secondary | ICD-10-CM | POA: Diagnosis not present

## 2016-07-21 DIAGNOSIS — E118 Type 2 diabetes mellitus with unspecified complications: Secondary | ICD-10-CM | POA: Diagnosis not present

## 2016-07-21 DIAGNOSIS — B356 Tinea cruris: Secondary | ICD-10-CM

## 2016-07-21 DIAGNOSIS — Z8639 Personal history of other endocrine, nutritional and metabolic disease: Secondary | ICD-10-CM

## 2016-07-21 LAB — CBC WITH DIFFERENTIAL/PLATELET
BASOS ABS: 0 10*3/uL (ref 0.0–0.1)
Basophils Relative: 0.7 % (ref 0.0–3.0)
EOS ABS: 0.3 10*3/uL (ref 0.0–0.7)
Eosinophils Relative: 5.9 % — ABNORMAL HIGH (ref 0.0–5.0)
HEMATOCRIT: 38.6 % (ref 36.0–46.0)
HEMOGLOBIN: 13 g/dL (ref 12.0–15.0)
LYMPHS PCT: 25.7 % (ref 12.0–46.0)
Lymphs Abs: 1.4 10*3/uL (ref 0.7–4.0)
MCHC: 33.5 g/dL (ref 30.0–36.0)
MONOS PCT: 7.8 % (ref 3.0–12.0)
Monocytes Absolute: 0.4 10*3/uL (ref 0.1–1.0)
Neutro Abs: 3.2 10*3/uL (ref 1.4–7.7)
Neutrophils Relative %: 59.9 % (ref 43.0–77.0)
Platelets: 444 10*3/uL — ABNORMAL HIGH (ref 150.0–400.0)
RBC: 3.48 Mil/uL — AB (ref 3.87–5.11)
RDW: 15.4 % (ref 11.5–15.5)
WBC: 5.3 10*3/uL (ref 4.0–10.5)

## 2016-07-21 LAB — POC URINALSYSI DIPSTICK (AUTOMATED)
Bilirubin, UA: NEGATIVE
GLUCOSE UA: NEGATIVE
Ketones, UA: NEGATIVE
Nitrite, UA: NEGATIVE
PH UA: 6 (ref 5.0–8.0)
Protein, UA: NEGATIVE
RBC UA: NEGATIVE
UROBILINOGEN UA: 0.2 U/dL

## 2016-07-21 LAB — BASIC METABOLIC PANEL
BUN: 16 mg/dL (ref 6–23)
CALCIUM: 9.4 mg/dL (ref 8.4–10.5)
CO2: 31 mEq/L (ref 19–32)
CREATININE: 0.74 mg/dL (ref 0.40–1.20)
Chloride: 102 mEq/L (ref 96–112)
GFR: 98.24 mL/min (ref >60.00)
Glucose, Bld: 191 mg/dL — ABNORMAL HIGH (ref 70–99)
Potassium: 4.5 mEq/L (ref 3.5–5.1)
SODIUM: 139 meq/L (ref 135–145)

## 2016-07-21 LAB — LIPID PANEL
CHOL/HDL RATIO: 5
Cholesterol: 267 mg/dL — ABNORMAL HIGH (ref 0–200)
HDL: 58.6 mg/dL (ref >39.00)
LDL CALC: 182 mg/dL — AB (ref 0–99)
NONHDL: 208.2
TRIGLYCERIDES: 130 mg/dL (ref 0.0–149.0)
VLDL: 26 mg/dL (ref 0.0–40.0)

## 2016-07-21 LAB — HEPATIC FUNCTION PANEL
ALK PHOS: 107 U/L (ref 39–117)
ALT: 7 U/L (ref 0–35)
AST: 10 U/L (ref 0–37)
Albumin: 4.1 g/dL (ref 3.5–5.2)
BILIRUBIN DIRECT: 0.1 mg/dL (ref 0.0–0.3)
BILIRUBIN TOTAL: 0.6 mg/dL (ref 0.2–1.2)
Total Protein: 6.6 g/dL (ref 6.0–8.3)

## 2016-07-21 LAB — HEMOGLOBIN A1C: Hgb A1c MFr Bld: 8.5 % — ABNORMAL HIGH (ref 4.6–6.5)

## 2016-07-21 LAB — TSH: TSH: 7.09 u[IU]/mL — AB (ref 0.35–4.50)

## 2016-07-21 MED ORDER — NYSTATIN 100000 UNIT/GM EX POWD: Freq: Two times a day (BID) | CUTANEOUS | 3 refills | Status: AC

## 2016-07-21 NOTE — Patient Instructions (Addendum)
It was great seeing you today   Please work on your diet and start monitoring your blood sugars at home.   I will follow up with you regarding your blood work   Follow up with me in 3 months for diabetic check   I have sent in a prescription for Nystatin Powder, use this twice a day

## 2016-07-21 NOTE — Progress Notes (Signed)
Subjective:    Patient ID: Amanda Conway, female    DOB: 29-Nov-1940, 76 y.o.   MRN: [id number]  HPI  Patient presents for yearly preventative medicine examination. She is a pleasant 76 year old female who  has a past medical history of CVA (cerebral vascular accident) ([place]); Diabetes mellitus; High cholesterol; Hypertension; and Hyperthyroidism.   All immunizations and health maintenance protocols were reviewed with the patient and needed orders were placed.  Appropriate screening laboratory values were ordered for the patient including screening of hyperlipidemia, renal function and hepatic function. If indicated by BPH, a PSA was ordered.  Medication reconciliation,  past medical history, social history, problem list and allergies were reviewed in detail with the patient  Goals were established with regard to weight loss, exercise, and  diet in compliance with medications. She has been working on changing her diet. She has cut out red meats and is working on reducing carbs.   End of life planning was discussed.  She takes Glipizide 10 mg for diabetes. Last A1c was 8.0. She has not been checking her blood sugars at home.   Blood pressure is controlled with Norvasc 5 mg and Lotensin 20 mg   Hyperlipidemia - She takes fish oil. Was unable to tolerate statin  She is up to date on her colonoscopy and vision screens. She had a mammogram in October 2017   Her only acute complaint is that of a fungal infection in her groin   Review of Systems  Constitutional: Negative.   HENT: Negative.   Eyes: Negative.   Respiratory: Negative.   Cardiovascular: Negative.   Gastrointestinal: Negative.   Endocrine: Negative.   Genitourinary: Negative.   Musculoskeletal: Negative.   Skin: Negative.   Allergic/Immunologic: Positive for food allergies.  Neurological: Negative.   Hematological: Negative.   Psychiatric/Behavioral: Negative.   All other systems reviewed and are negative.  Past  Medical History:  Diagnosis Date  . CVA (cerebral vascular accident) ([place])   . Diabetes mellitus   . High cholesterol   . Hypertension   . Hyperthyroidism     Social History   Social History  . Marital status: Single    Spouse name: N/A  . Number of children: N/A  . Years of education: N/A   Occupational History  . Not on file.   Social History Main Topics  . Smoking status: Former Smoker    Types: Cigarettes    Quit date: 02/28/1971  . Smokeless tobacco: Never Used  . Alcohol use No  . Drug use: No  . Sexual activity: No   Other Topics Concern  . Not on file   Social History Narrative   Retired from being a receptionist   Has three children    She enjoys gardening, Control and instrumentation engineer, and shopping     Past Surgical History:  Procedure Laterality Date  . bilateral rotator cuff repair      Family History  Problem Relation Age of Onset  . Diabetes Mother   . Mesothelioma Father     Allergies  Allergen Reactions  . Banana     Lungs collapsed   . Corn-Containing Products     Tongue swells, pt's tolerated recently  . Fish Allergy Itching    In large amounts  . Metformin And Related Diarrhea  . Statins   . Sulfonamide Derivatives     REACTION: family hx    Current Outpatient Prescriptions on File Prior to Visit  Medication Sig Dispense Refill  . amLODipine (  NORVASC) 5 MG tablet Take 1 tablet (5 mg total) by mouth daily. 90 tablet 0  . aspirin EC 325 MG EC tablet Take 1 tablet (325 mg total) by mouth daily. 30 tablet 0  . benazepril (LOTENSIN) 20 MG tablet Take 1 tablet (20 mg total) by mouth daily. 90 tablet 2  . glipiZIDE (GLUCOTROL) 10 MG tablet TAKE ONE TABLET BY MOUTH TWICE DAILY BEFORE  A  MEAL 180 tablet 0  . glucose blood test strip Use as instructed 100 each 12  . Lancets (ONETOUCH ULTRASOFT) lancets Use as instructed 100 each 12  . Multiple Vitamins-Minerals (MULTIVITAL PO) Take by mouth.    . Nutritional Supplements (JOINT FORMULA PO) Take by mouth. Joint  juice    . Omega-3 Fatty Acids (FISH OIL) 1200 MG CPDR Take 1 capsule by mouth 3 (three) times a week.      No current facility-administered medications on file prior to visit.     BP 126/72 (BP Location: Left Arm, Patient Position: Sitting, Cuff Size: Normal)   Temp 98.6 F (37 C) (Oral)   Ht 5' (1.524 m)   Wt 146 lb 12.8 oz (66.6 kg)   BMI 28.67 kg/m       Objective:   Physical Exam  Constitutional: She is oriented to person, place, and time. She appears well-developed and well-nourished. No distress.  HENT:  Head: Normocephalic and atraumatic.  Right Ear: External ear normal.  Left Ear: External ear normal.  Nose: Nose normal.  Mouth/Throat: Oropharynx is clear and moist. No oropharyngeal exudate.  Eyes: Conjunctivae and EOM are normal. Pupils are equal, round, and reactive to light. Right eye exhibits no discharge. Left eye exhibits no discharge. No scleral icterus.  Neck: Normal range of motion. Neck supple. No thyromegaly present.  Cardiovascular: Normal rate, regular rhythm, normal heart sounds and intact distal pulses.  Exam reveals no gallop and no friction rub.   No murmur heard. Pulmonary/Chest: Effort normal and breath sounds normal. No respiratory distress. She has no wheezes. She has no rales. She exhibits no tenderness.  Abdominal: Soft. Bowel sounds are normal. She exhibits no distension and no mass. There is no tenderness. There is no rebound and no guarding.  Genitourinary:  Genitourinary Comments: Breast Exam: Fibrous breast tissue. No masses, lumps, dimpling, or discharge   Musculoskeletal: Normal range of motion. She exhibits no edema, tenderness or deformity.  Lymphadenopathy:    She has no cervical adenopathy.  Neurological: She is alert and oriented to person, place, and time. She has normal reflexes. She displays normal reflexes. No cranial nerve deficit. She exhibits normal muscle tone. Coordination normal.  Skin: Skin is warm and dry. No rash noted. She  is not diaphoretic. No erythema. No pallor.  + tinea cruris on bilateral sides of groin    Psychiatric: She has a normal mood and affect. Her behavior is normal. Judgment and thought content normal.  Nursing note and vitals reviewed.     Assessment & Plan:  1. Routine general medical examination at a health care facility - Basic metabolic panel - CBC with Differential/Platelet - Hemoglobin A1c - Hepatic function panel - Lipid panel - POCT Urinalysis Dipstick (Automated) - TSH  2. Essential hypertension - Controlled - no change in medications - Basic metabolic panel - CBC with Differential/Platelet - Hemoglobin A1c - Hepatic function panel - Lipid panel - POCT Urinalysis Dipstick (Automated) - TSH  3. Type 2 diabetes mellitus with complication, without long-term current use of insulin (HCC) - Her  diet continues to seem poor. She was educated on the importance of maintaining a diabetic diet and exercise - I would like her to start monitoring blood sugars at home and bring log to next visit  - Follow up in 3 months  - Consider adding agent  - Basic metabolic panel - CBC with Differential/Platelet - Hemoglobin A1c - Hepatic function panel - Lipid panel - POCT Urinalysis Dipstick (Automated) - TSH  4. Hyperlipidemia, unspecified hyperlipidemia type - Basic metabolic panel - CBC with Differential/Platelet - Hemoglobin A1c - Hepatic function panel - Lipid panel - POCT Urinalysis Dipstick (Automated) - TSH - Consider fenofibrate   5. History of hyperthyroidism  - Basic metabolic panel - CBC with Differential/Platelet - Hemoglobin A1c - Hepatic function panel - Lipid panel - POCT Urinalysis Dipstick (Automated) - TSH - Consider restarting medication   6. Tinea cruris  - nystatin (MYCOSTATIN/NYSTOP) powder; Apply topically 2 (two) times daily.  Dispense: 15 g; Refill: 3   Dorothyann Peng, NP

## 2016-08-04 ENCOUNTER — Ambulatory Visit (INDEPENDENT_AMBULATORY_CARE_PROVIDER_SITE_OTHER): Payer: Medicare Other | Admitting: Adult Health

## 2016-08-04 ENCOUNTER — Encounter: Payer: Self-pay | Admitting: Adult Health

## 2016-08-04 VITALS — BP 118/70 | Temp 98.1°F | Ht 60.0 in | Wt 145.2 lb

## 2016-08-04 DIAGNOSIS — R600 Localized edema: Secondary | ICD-10-CM

## 2016-08-04 NOTE — Progress Notes (Signed)
Subjective:    Patient ID: Amanda Conway, female    DOB: [birth year], 76 y.o.   MRN: [id number]  HPI  76 year old female who  has a past medical history of CVA (cerebral vascular accident) ([place]); Diabetes mellitus; High cholesterol; Hypertension; and Hyperthyroidism.  She presents to the office today for lower extremity edema. Her brother is in the hospital and she has not been eating well and not drinking. She has been having to sleep in a chair while at the hospital. Her swelling is contained to her ankles.   Denies any calf pain, redness or warmth  Review of Systems See HP I  Past Medical History:  Diagnosis Date  . CVA (cerebral vascular accident) ([place])   . Diabetes mellitus   . High cholesterol   . Hypertension   . Hyperthyroidism     Social History   Social History  . Marital status: Single    Spouse name: N/A  . Number of children: N/A  . Years of education: N/A   Occupational History  . Not on file.   Social History Main Topics  . Smoking status: Former Smoker    Types: Cigarettes    Quit date: 02/28/1971  . Smokeless tobacco: Never Used  . Alcohol use No  . Drug use: No  . Sexual activity: No   Other Topics Concern  . Not on file   Social History Narrative   Retired from being a receptionist   Has three children    She enjoys gardening, Control and instrumentation engineer, and shopping     Past Surgical History:  Procedure Laterality Date  . bilateral rotator cuff repair      Family History  Problem Relation Age of Onset  . Diabetes Mother   . Mesothelioma Father     Allergies  Allergen Reactions  . Banana     Lungs collapsed   . Corn-Containing Products     Tongue swells, pt's tolerated recently  . Fish Allergy Itching    In large amounts  . Metformin And Related Diarrhea  . Statins   . Sulfonamide Derivatives     REACTION: family hx    Current Outpatient Prescriptions on File Prior to Visit  Medication Sig Dispense Refill  . amLODipine (NORVASC) 5 MG  tablet Take 1 tablet (5 mg total) by mouth daily. 90 tablet 0  . aspirin EC 325 MG EC tablet Take 1 tablet (325 mg total) by mouth daily. 30 tablet 0  . benazepril (LOTENSIN) 20 MG tablet Take 1 tablet (20 mg total) by mouth daily. 90 tablet 2  . glipiZIDE (GLUCOTROL) 10 MG tablet TAKE ONE TABLET BY MOUTH TWICE DAILY BEFORE  A  MEAL 180 tablet 0  . glucose blood test strip Use as instructed 100 each 12  . Lancets (ONETOUCH ULTRASOFT) lancets Use as instructed 100 each 12  . Multiple Vitamins-Minerals (MULTIVITAL PO) Take by mouth.    . Nutritional Supplements (JOINT FORMULA PO) Take by mouth. Joint juice    . nystatin (MYCOSTATIN/NYSTOP) powder Apply topically 2 (two) times daily. 15 g 3  . Omega-3 Fatty Acids (FISH OIL) 1200 MG CPDR Take 1 capsule by mouth 3 (three) times a week.      No current facility-administered medications on file prior to visit.     BP 118/70 (BP Location: Left Arm, Patient Position: Sitting, Cuff Size: Small)   Temp 98.1 F (36.7 C) (Oral)   Ht 5' (1.524 m)   Wt 145 lb 3.2 oz (65.9  kg)   BMI 28.36 kg/m       Objective:   Physical Exam  Constitutional: She appears well-developed and well-nourished. No distress.  Pulmonary/Chest: Effort normal and breath sounds normal.  Abdominal: Soft. Bowel sounds are normal. She exhibits no distension and no mass. There is no tenderness. There is no rebound and no guarding.  Musculoskeletal: She exhibits edema (bilateral non pitting edema in lower extremities ). She exhibits no tenderness or deformity.  Skin: Skin is warm and dry. No rash noted. She is not diaphoretic. No erythema. No pallor.  Psychiatric: She has a normal mood and affect. Her behavior is normal. Judgment and thought content normal.  Nursing note and vitals reviewed.     Assessment & Plan:  1. Lower extremity edema - likely due to poor diet and inadequate hydration  - Needs to eat a low sodium diet and drink plenty of water throughout the day  -  elevate legs at night or while resting  - Follow up if not improved  Dorothyann Peng, NP

## 2016-08-16 ENCOUNTER — Encounter: Payer: Self-pay | Admitting: Physical Therapy

## 2016-08-16 NOTE — Therapy (Signed)
[facility] [address] [place], [place], [address] Phone: [phone]   Fax:  [phone]  Patient Details  Name: Amanda Conway MRN: [id number] Date of Birth: Apr 22, 1940 Referring Provider:  Stevie Kern, MD  Encounter Date: 08/16/2016  PHYSICAL THERAPY DISCHARGE SUMMARY  Visits from Start of Care: 5  Current functional level related to goals / functional outcomes:     PT Long Term Goals - 06/26/15 1513      PT LONG TERM GOAL #1   Title Patient verbalizes understanding of ongoing HEP, fitness plan. (Target Date: 07/31/2015)   Baseline met on 06/26/15   Status Achieved     PT LONG TERM GOAL #2   Title Berg Balance Test > 52/56 to indicate lower fall risk. (Target Date: 07/31/2015)   Baseline 06/26/15: 54/56 scored today   Time --   Period --   Status Achieved     PT LONG TERM GOAL #3   Title Functional Gait Assessment >/= 19/30 to indicate lower fall risk. (Target Date: 07/31/2015)   Baseline 06/26/15: 27/30 today   Status Achieved     PT LONG TERM GOAL #4   Title Patient ambulates 1000' including grass, ramps, curbs, stairs (1 rail) reciprocal without device independently to enable safe community access. (Target Date: 07/31/2015)   Baseline met on 06/26/15   Status Achieved     PT LONG TERM GOAL #5   Title Patient demonstrates tasks to enable return to gardening modified independent safely. (Target Date: 07/31/2015)   Baseline 06/26/15: pt reports back gardening without any issues   Status Achieved        Remaining deficits: See Merrilee Jansky Balance Test & Functional Gait Assessment scores noted above which indicate low fall risk.    Education / Equipment: HEP  Plan: Patient agrees to discharge.  Patient goals were met. Patient is being discharged due to meeting the stated rehab goals.  ?????         Arlie Posch PT, DPT 08/16/2016, 12:03 PM  [facility] [address] [place], [place], [address] Phone: [phone]   Fax:  [phone]

## 2016-09-05 IMAGING — MR MR MRA HEAD W/O CM
9 of 11 series · 33 of 48 positions shown · non-contrast
Comparison: 02/28/2011

CLINICAL DATA: Awoke last night with tingling in the right side of
the body. Symptoms are persistent today.

EXAM:
MRI HEAD WITHOUT CONTRAST
MRA HEAD WITHOUT CONTRAST
TECHNIQUE: Multiplanar, multiecho pulse sequences of the brain and surrounding
structures were obtained without intravenous contrast. Angiographic
images of the head were obtained using MRA technique without
contrast.

[Series 3: T1 · sagittal · 5.0mm · 0.47mm/px · 3 of 24 slices shown]
[im 1/24]
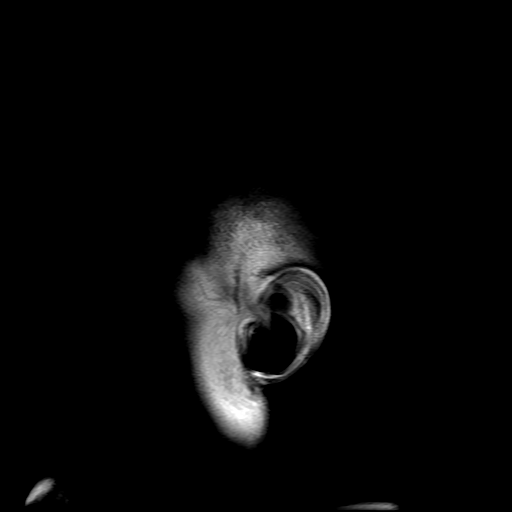
[im 12/24]
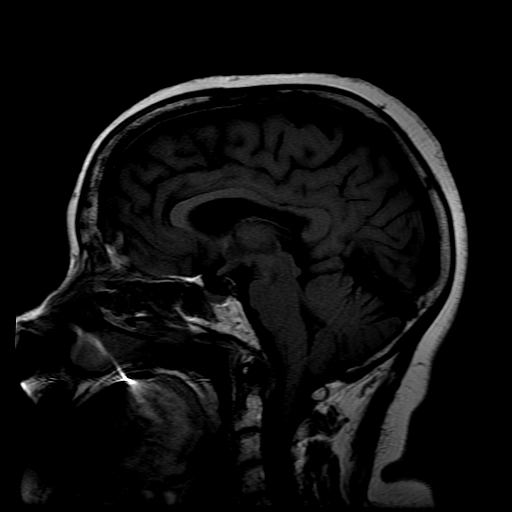
[im 24/24]
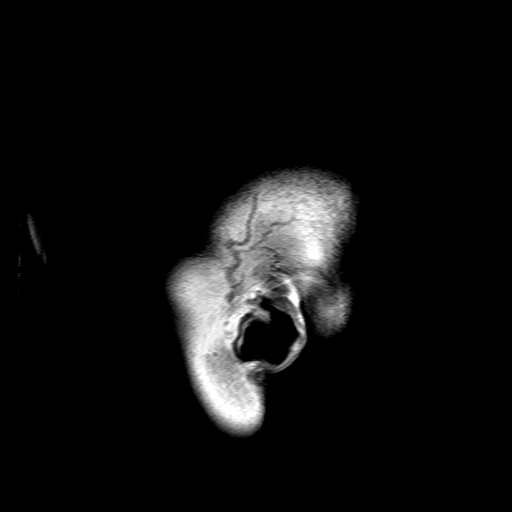

[Series 4: DWI · axial · 3.0mm · 1.09mm/px · z∈[-57,+89]mm · 8 of 100 slices shown (1 of 4)]
[im 1/100]
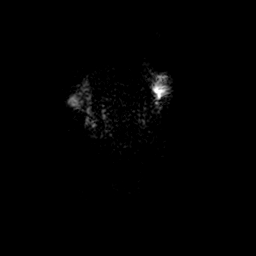
[im 15/100]
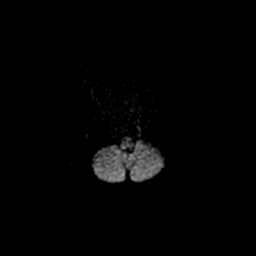
[im 29/100]
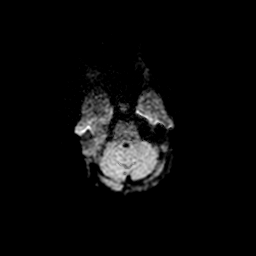
[im 43/100]
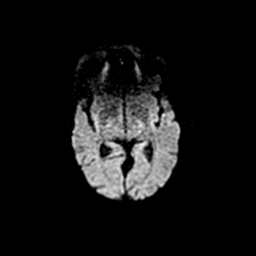
[im 57/100]
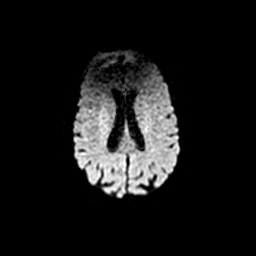
[im 71/100]
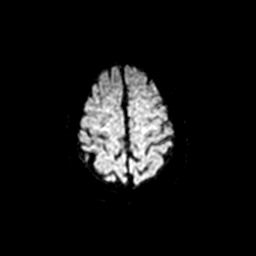
[im 85/100]
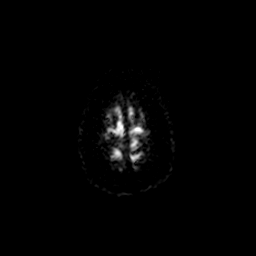
[im 100/100]
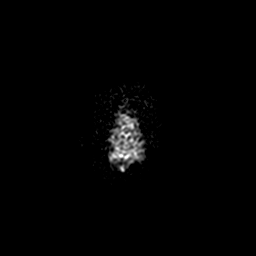

[Series 5: DWI · coronal · 5.0mm · 1.09mm/px · 6 of 70 slices shown (2 of 4)]
[im 1/70]
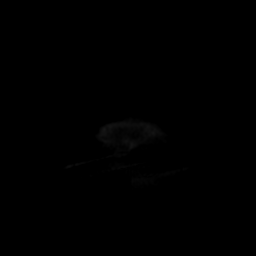
[im 14/70]
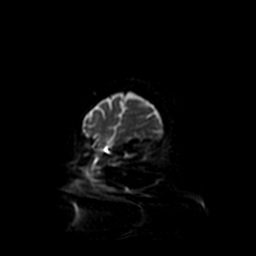
[im 28/70]
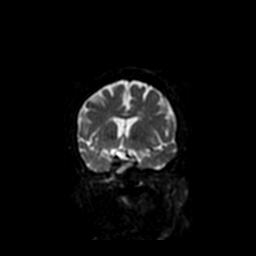
[im 42/70]
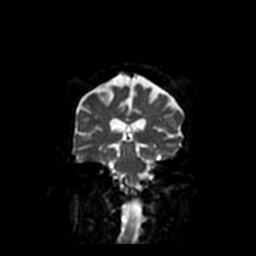
[im 56/70]
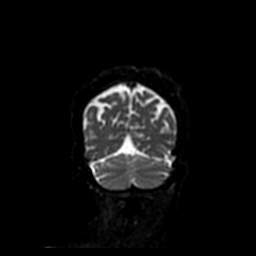
[im 70/70]
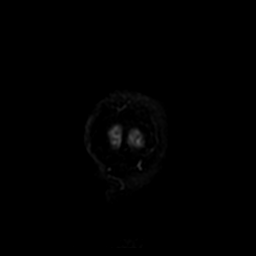

[Series 6: T2 · axial · 5.0mm · 0.43mm/px · z∈[-58,+84]mm · 2 of 23 slices shown]
[im 1/23]
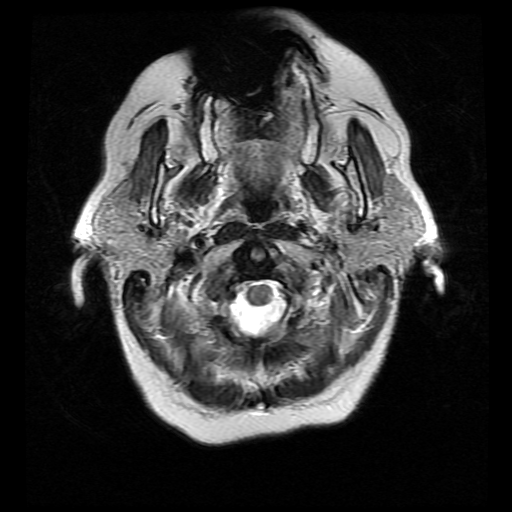
[im 23/23]
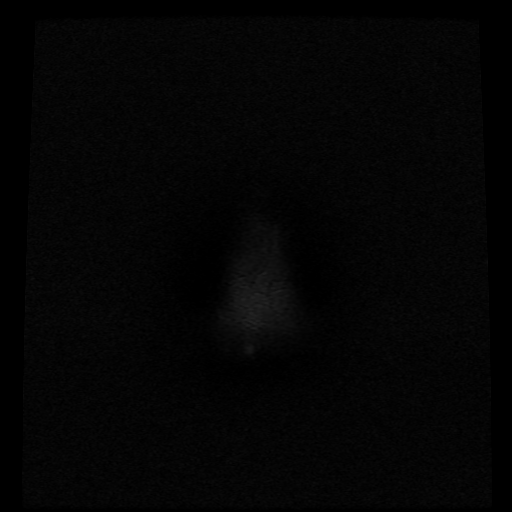

[Series 7: (id) mt fs · axial · 1.4mm · 0.39mm/px · z∈[-45,-16]mm · 3 of 143 slices shown]
[im 1/143]
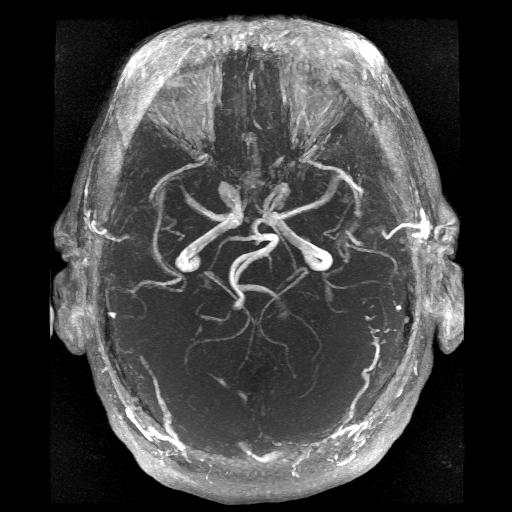
[im 26/143]
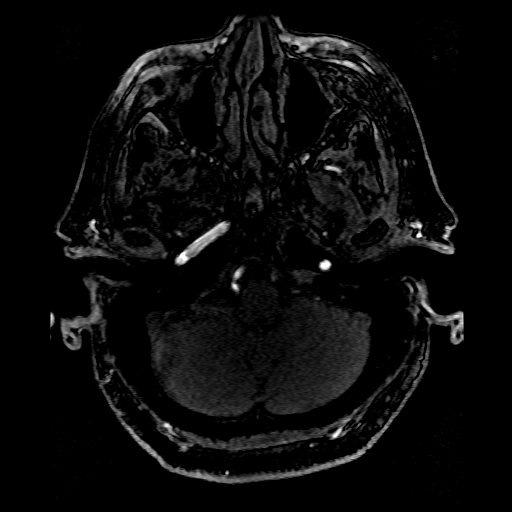
[im 39/143]
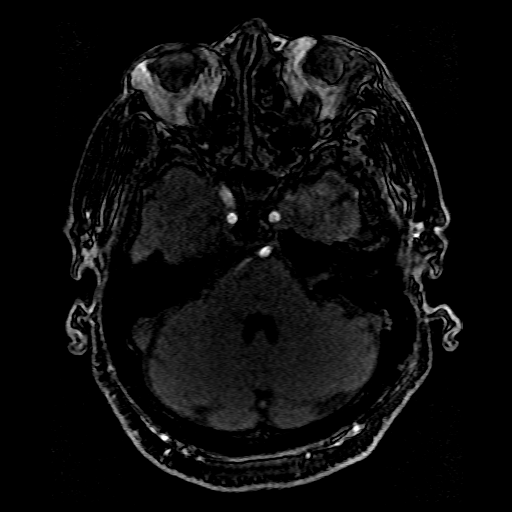

[Series 8: FLAIR · axial · 5.0mm · 0.43mm/px · z∈[-64,+89]mm · 2 of 23 slices shown]
[im 1/23]
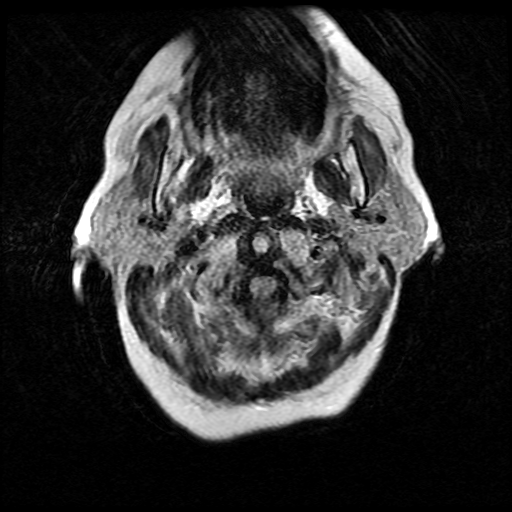
[im 23/23]
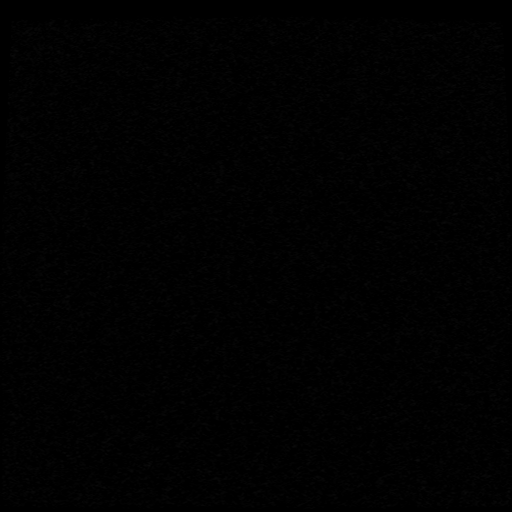

[Series 11: T2 post-contrast · coronal · 5.0mm · 0.45mm/px · 2 of 28 slices shown]
[im 1/28]
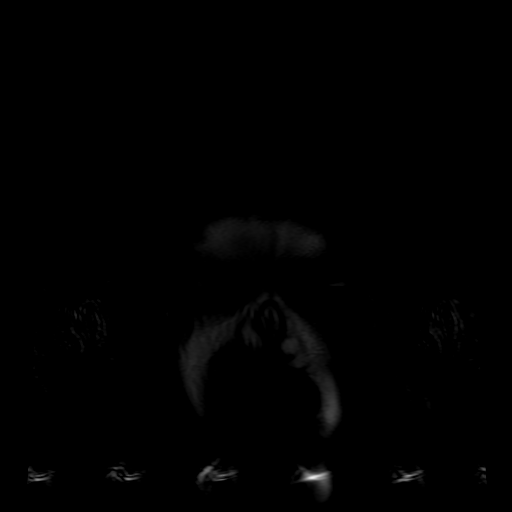
[im 28/28]
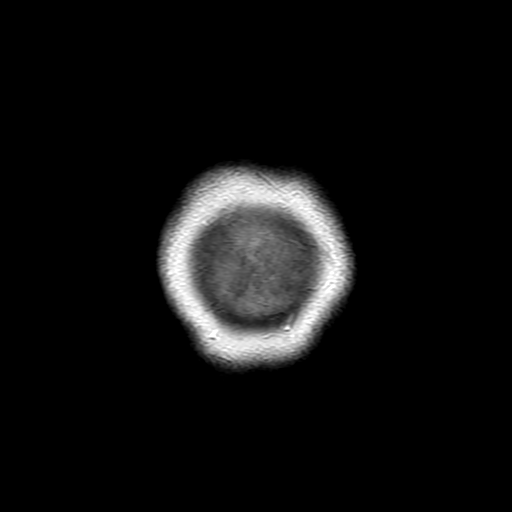

[Series 400: DWI · axial · 3.0mm · 1.09mm/px · z∈[-57,+89]mm · 4 of 50 slices shown (3 of 4)]
[im 1/50]
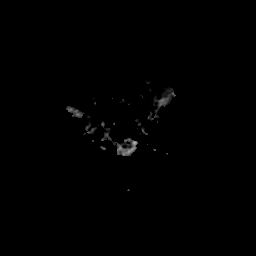
[im 17/50]
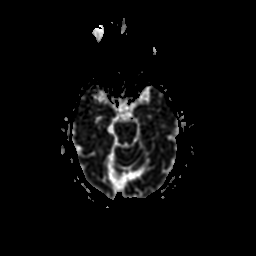
[im 33/50]
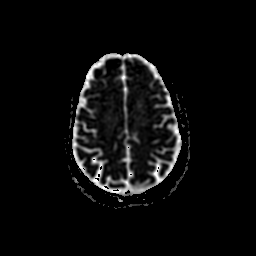
[im 50/50]
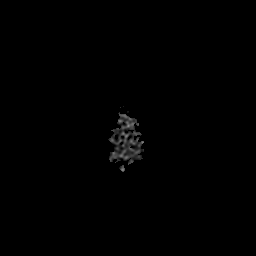

[Series 500: DWI · coronal · 5.0mm · 1.09mm/px · 3 of 35 slices shown (4 of 4)]
[im 1/35]
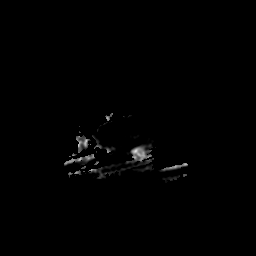
[im 18/35]
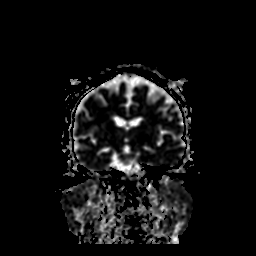
[im 35/35]
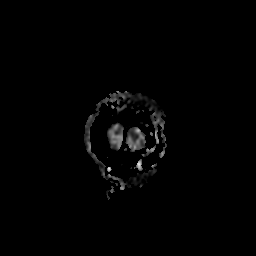

[33 of 48 positions shown; findings below may reference images not displayed]

FINDINGS: MRI HEAD FINDINGS

There is a 6 mm acute infarction in the left lateral thalamus. No
other acute infarction. The brainstem is normal. No cerebellar
insult. Cerebral hemispheres show mild to moderate chronic
small-vessel ischemic changes of the deep and subcortical white
matter. No large vessel territory infarction. No mass lesion,
hemorrhage, hydrocephalus or extra-axial collection. No pituitary
mass. No significant sinus disease.

MRA HEAD FINDINGS

Both internal carotid arteries are widely patent through the
skullbase and siphon regions. There is a sessile atherosclerotic
aneurysm projecting inferiorly from the proximal carotid siphon on
the left measuring approximately 4-5 mm in size. Supra clinoid
internal carotid arteries are normal. The anterior and middle
cerebral arteries are widely patent. Distal vessels do show
atherosclerotic irregularity.

Both vertebral arteries are widely patent to the basilar. No
vertebral or basilar stenosis. Posterior circulation branch vessels
are patent but do show some atherosclerotic irregularity. No flow
limiting stenosis.
IMPRESSION: 6 mm acute infarction left lateral thalamus.

Moderate chronic small vessel ischemic changes elsewhere.

No large or medium vessel occlusion or correctable proximal
stenosis.

Atherosclerotic irregularity of the more distal branch vessels
diffusely.

4-5 mm sessile atherosclerotic aneurysm projecting inferiorly from
the proximal carotid siphon on the left.

## 2016-09-24 ENCOUNTER — Other Ambulatory Visit: Payer: Self-pay | Admitting: Adult Health

## 2016-10-04 ENCOUNTER — Ambulatory Visit: Payer: Medicare Other | Admitting: Adult Health

## 2017-03-16 ENCOUNTER — Other Ambulatory Visit: Payer: Self-pay | Admitting: Family Medicine

## 2017-03-16 DIAGNOSIS — Z1231 Encounter for screening mammogram for malignant neoplasm of breast: Secondary | ICD-10-CM

## 2017-04-06 ENCOUNTER — Ambulatory Visit
Admission: RE | Admit: 2017-04-06 | Discharge: 2017-04-06 | Disposition: A | Discharge status: Home / Self Care | Payer: Medicare Other | Source: Ambulatory Visit | Attending: Family Medicine | Admitting: Family Medicine

## 2017-04-06 DIAGNOSIS — Z1231 Encounter for screening mammogram for malignant neoplasm of breast: Secondary | ICD-10-CM

## 2017-04-16 ENCOUNTER — Other Ambulatory Visit: Payer: Self-pay | Admitting: Adult Health

## 2017-04-18 ENCOUNTER — Encounter: Payer: Self-pay | Admitting: Family Medicine

## 2017-04-18 NOTE — Telephone Encounter (Signed)
Sent to the pharmacy by e-scribe for 90 days.  Letter mailed to the pt to call and schedule cpx.  Pt due on or after 07/21/17.

## 2017-05-13 DIAGNOSIS — R0789 Other chest pain: Secondary | ICD-10-CM | POA: Diagnosis not present

## 2017-05-13 DIAGNOSIS — I10 Essential (primary) hypertension: Secondary | ICD-10-CM | POA: Diagnosis not present

## 2017-05-13 DIAGNOSIS — M25561 Pain in right knee: Secondary | ICD-10-CM | POA: Diagnosis not present

## 2017-05-13 DIAGNOSIS — E119 Type 2 diabetes mellitus without complications: Secondary | ICD-10-CM | POA: Diagnosis not present

## 2017-05-22 DIAGNOSIS — Z1211 Encounter for screening for malignant neoplasm of colon: Secondary | ICD-10-CM | POA: Diagnosis not present

## 2017-05-22 DIAGNOSIS — E119 Type 2 diabetes mellitus without complications: Secondary | ICD-10-CM | POA: Diagnosis not present

## 2017-05-22 DIAGNOSIS — I10 Essential (primary) hypertension: Secondary | ICD-10-CM | POA: Diagnosis not present

## 2017-05-22 DIAGNOSIS — E7800 Pure hypercholesterolemia, unspecified: Secondary | ICD-10-CM | POA: Diagnosis not present

## 2017-05-22 DIAGNOSIS — Z0000 Encounter for general adult medical examination without abnormal findings: Secondary | ICD-10-CM | POA: Diagnosis not present

## 2017-05-29 DIAGNOSIS — L84 Corns and callosities: Secondary | ICD-10-CM | POA: Diagnosis not present

## 2017-05-29 DIAGNOSIS — B351 Tinea unguium: Secondary | ICD-10-CM | POA: Diagnosis not present

## 2017-05-29 DIAGNOSIS — E119 Type 2 diabetes mellitus without complications: Secondary | ICD-10-CM | POA: Diagnosis not present

## 2017-05-29 DIAGNOSIS — M7741 Metatarsalgia, right foot: Secondary | ICD-10-CM | POA: Diagnosis not present

## 2017-06-06 DIAGNOSIS — Z1212 Encounter for screening for malignant neoplasm of rectum: Secondary | ICD-10-CM | POA: Diagnosis not present

## 2017-06-06 DIAGNOSIS — Z1211 Encounter for screening for malignant neoplasm of colon: Secondary | ICD-10-CM | POA: Diagnosis not present

## 2017-06-08 DIAGNOSIS — E119 Type 2 diabetes mellitus without complications: Secondary | ICD-10-CM | POA: Diagnosis not present

## 2017-06-08 DIAGNOSIS — E785 Hyperlipidemia, unspecified: Secondary | ICD-10-CM | POA: Diagnosis not present

## 2017-06-19 DIAGNOSIS — E7800 Pure hypercholesterolemia, unspecified: Secondary | ICD-10-CM | POA: Diagnosis not present

## 2017-06-19 DIAGNOSIS — I10 Essential (primary) hypertension: Secondary | ICD-10-CM | POA: Diagnosis not present

## 2017-06-19 DIAGNOSIS — M545 Low back pain: Secondary | ICD-10-CM | POA: Diagnosis not present

## 2017-06-19 DIAGNOSIS — E119 Type 2 diabetes mellitus without complications: Secondary | ICD-10-CM | POA: Diagnosis not present

## 2017-06-22 DIAGNOSIS — M5137 Other intervertebral disc degeneration, lumbosacral region: Secondary | ICD-10-CM | POA: Diagnosis not present

## 2017-06-22 DIAGNOSIS — G8929 Other chronic pain: Secondary | ICD-10-CM | POA: Diagnosis not present

## 2017-06-22 DIAGNOSIS — Z780 Asymptomatic menopausal state: Secondary | ICD-10-CM | POA: Diagnosis not present

## 2017-06-22 DIAGNOSIS — M545 Low back pain: Secondary | ICD-10-CM | POA: Diagnosis not present

## 2017-06-22 DIAGNOSIS — E119 Type 2 diabetes mellitus without complications: Secondary | ICD-10-CM | POA: Diagnosis not present

## 2017-06-30 DIAGNOSIS — M545 Low back pain: Secondary | ICD-10-CM | POA: Diagnosis not present

## 2017-06-30 DIAGNOSIS — Z780 Asymptomatic menopausal state: Secondary | ICD-10-CM | POA: Diagnosis not present

## 2017-07-07 DIAGNOSIS — I10 Essential (primary) hypertension: Secondary | ICD-10-CM | POA: Diagnosis not present

## 2017-07-07 DIAGNOSIS — E7800 Pure hypercholesterolemia, unspecified: Secondary | ICD-10-CM | POA: Diagnosis not present

## 2017-07-07 DIAGNOSIS — Z789 Other specified health status: Secondary | ICD-10-CM | POA: Diagnosis not present

## 2017-07-24 DIAGNOSIS — E559 Vitamin D deficiency, unspecified: Secondary | ICD-10-CM | POA: Diagnosis not present

## 2017-07-24 DIAGNOSIS — I639 Cerebral infarction, unspecified: Secondary | ICD-10-CM | POA: Diagnosis not present

## 2017-07-24 DIAGNOSIS — E119 Type 2 diabetes mellitus without complications: Secondary | ICD-10-CM | POA: Diagnosis not present

## 2017-07-24 DIAGNOSIS — I10 Essential (primary) hypertension: Secondary | ICD-10-CM | POA: Diagnosis not present

## 2017-08-29 DIAGNOSIS — M7741 Metatarsalgia, right foot: Secondary | ICD-10-CM | POA: Diagnosis not present

## 2017-08-29 DIAGNOSIS — L84 Corns and callosities: Secondary | ICD-10-CM | POA: Diagnosis not present

## 2017-08-29 DIAGNOSIS — E119 Type 2 diabetes mellitus without complications: Secondary | ICD-10-CM | POA: Diagnosis not present

## 2017-08-29 DIAGNOSIS — B351 Tinea unguium: Secondary | ICD-10-CM | POA: Diagnosis not present

## 2017-09-22 DIAGNOSIS — L0390 Cellulitis, unspecified: Secondary | ICD-10-CM | POA: Diagnosis not present

## 2017-09-22 DIAGNOSIS — I10 Essential (primary) hypertension: Secondary | ICD-10-CM | POA: Diagnosis not present

## 2017-09-22 DIAGNOSIS — W57XXXA Bitten or stung by nonvenomous insect and other nonvenomous arthropods, initial encounter: Secondary | ICD-10-CM | POA: Diagnosis not present

## 2017-09-22 DIAGNOSIS — E119 Type 2 diabetes mellitus without complications: Secondary | ICD-10-CM | POA: Diagnosis not present

## 2017-09-25 ENCOUNTER — Other Ambulatory Visit: Payer: Self-pay

## 2017-09-25 NOTE — Patient Outreach (Signed)
[facility]) Care Management  09/25/2017  Amanda Conway September 22, 1940 [id number]   Medication Adherence call to Mrs. Kirstin Caraveo left a message for patient to call back patient is due on Benazepril 5 mg and Glipizide Xl 10 mg. Mrs. Kinn is showing past due under [place].  [facility] Direct Dial [phone]  Fax [phone] [email or web address]

## 2017-11-30 DIAGNOSIS — E119 Type 2 diabetes mellitus without complications: Secondary | ICD-10-CM | POA: Diagnosis not present

## 2017-11-30 DIAGNOSIS — M7741 Metatarsalgia, right foot: Secondary | ICD-10-CM | POA: Diagnosis not present

## 2017-11-30 DIAGNOSIS — B351 Tinea unguium: Secondary | ICD-10-CM | POA: Diagnosis not present

## 2017-11-30 DIAGNOSIS — L84 Corns and callosities: Secondary | ICD-10-CM | POA: Diagnosis not present

## 2018-03-20 DIAGNOSIS — I10 Essential (primary) hypertension: Secondary | ICD-10-CM | POA: Diagnosis not present

## 2018-03-20 DIAGNOSIS — E119 Type 2 diabetes mellitus without complications: Secondary | ICD-10-CM | POA: Diagnosis not present

## 2018-03-20 DIAGNOSIS — R0789 Other chest pain: Secondary | ICD-10-CM | POA: Diagnosis not present

## 2018-03-20 DIAGNOSIS — Z8673 Personal history of transient ischemic attack (TIA), and cerebral infarction without residual deficits: Secondary | ICD-10-CM | POA: Diagnosis not present

## 2018-03-20 DIAGNOSIS — Z0000 Encounter for general adult medical examination without abnormal findings: Secondary | ICD-10-CM | POA: Diagnosis not present

## 2018-03-21 DIAGNOSIS — K2970 Gastritis, unspecified, without bleeding: Secondary | ICD-10-CM | POA: Diagnosis not present

## 2018-03-21 DIAGNOSIS — Z91018 Allergy to other foods: Secondary | ICD-10-CM | POA: Diagnosis not present

## 2018-03-21 DIAGNOSIS — I10 Essential (primary) hypertension: Secondary | ICD-10-CM | POA: Diagnosis not present

## 2018-03-21 DIAGNOSIS — R079 Chest pain, unspecified: Secondary | ICD-10-CM | POA: Diagnosis not present

## 2018-03-21 DIAGNOSIS — K210 Gastro-esophageal reflux disease with esophagitis: Secondary | ICD-10-CM | POA: Diagnosis not present

## 2018-03-21 DIAGNOSIS — E119 Type 2 diabetes mellitus without complications: Secondary | ICD-10-CM | POA: Diagnosis not present

## 2018-03-21 DIAGNOSIS — K648 Other hemorrhoids: Secondary | ICD-10-CM | POA: Diagnosis not present

## 2018-03-21 DIAGNOSIS — D500 Iron deficiency anemia secondary to blood loss (chronic): Secondary | ICD-10-CM | POA: Diagnosis not present

## 2018-03-21 DIAGNOSIS — Z79899 Other long term (current) drug therapy: Secondary | ICD-10-CM | POA: Diagnosis not present

## 2018-03-21 DIAGNOSIS — R000 Tachycardia, unspecified: Secondary | ICD-10-CM | POA: Diagnosis not present

## 2018-03-21 DIAGNOSIS — Z7982 Long term (current) use of aspirin: Secondary | ICD-10-CM | POA: Diagnosis not present

## 2018-03-21 DIAGNOSIS — E1169 Type 2 diabetes mellitus with other specified complication: Secondary | ICD-10-CM | POA: Diagnosis not present

## 2018-03-21 DIAGNOSIS — K2980 Duodenitis without bleeding: Secondary | ICD-10-CM | POA: Diagnosis not present

## 2018-03-21 DIAGNOSIS — R6883 Chills (without fever): Secondary | ICD-10-CM | POA: Diagnosis not present

## 2018-03-21 DIAGNOSIS — K922 Gastrointestinal hemorrhage, unspecified: Secondary | ICD-10-CM | POA: Diagnosis not present

## 2018-03-21 DIAGNOSIS — K449 Diaphragmatic hernia without obstruction or gangrene: Secondary | ICD-10-CM | POA: Diagnosis not present

## 2018-03-21 DIAGNOSIS — K5790 Diverticulosis of intestine, part unspecified, without perforation or abscess without bleeding: Secondary | ICD-10-CM | POA: Diagnosis not present

## 2018-03-21 DIAGNOSIS — Z91048 Other nonmedicinal substance allergy status: Secondary | ICD-10-CM | POA: Diagnosis not present

## 2018-03-21 DIAGNOSIS — K317 Polyp of stomach and duodenum: Secondary | ICD-10-CM | POA: Diagnosis not present

## 2018-03-21 DIAGNOSIS — D649 Anemia, unspecified: Secondary | ICD-10-CM | POA: Diagnosis not present

## 2018-03-21 DIAGNOSIS — K279 Peptic ulcer, site unspecified, unspecified as acute or chronic, without hemorrhage or perforation: Secondary | ICD-10-CM | POA: Diagnosis not present

## 2018-03-21 DIAGNOSIS — Z91013 Allergy to seafood: Secondary | ICD-10-CM | POA: Diagnosis not present

## 2018-03-21 DIAGNOSIS — D120 Benign neoplasm of cecum: Secondary | ICD-10-CM | POA: Diagnosis not present

## 2018-03-21 DIAGNOSIS — D509 Iron deficiency anemia, unspecified: Secondary | ICD-10-CM | POA: Diagnosis not present

## 2018-03-21 DIAGNOSIS — K2900 Acute gastritis without bleeding: Secondary | ICD-10-CM | POA: Diagnosis not present

## 2018-03-21 DIAGNOSIS — D539 Nutritional anemia, unspecified: Secondary | ICD-10-CM | POA: Diagnosis not present

## 2018-03-21 DIAGNOSIS — Z7984 Long term (current) use of oral hypoglycemic drugs: Secondary | ICD-10-CM | POA: Diagnosis not present

## 2018-03-21 DIAGNOSIS — K5730 Diverticulosis of large intestine without perforation or abscess without bleeding: Secondary | ICD-10-CM | POA: Diagnosis not present

## 2018-03-21 DIAGNOSIS — I69951 Hemiplegia and hemiparesis following unspecified cerebrovascular disease affecting right dominant side: Secondary | ICD-10-CM | POA: Diagnosis not present

## 2018-03-21 DIAGNOSIS — Z882 Allergy status to sulfonamides status: Secondary | ICD-10-CM | POA: Diagnosis not present

## 2018-03-21 DIAGNOSIS — K635 Polyp of colon: Secondary | ICD-10-CM | POA: Diagnosis not present

## 2018-03-27 DIAGNOSIS — R55 Syncope and collapse: Secondary | ICD-10-CM | POA: Diagnosis not present

## 2018-03-27 DIAGNOSIS — Z8673 Personal history of transient ischemic attack (TIA), and cerebral infarction without residual deficits: Secondary | ICD-10-CM | POA: Diagnosis not present

## 2018-03-27 DIAGNOSIS — I6529 Occlusion and stenosis of unspecified carotid artery: Secondary | ICD-10-CM | POA: Diagnosis not present

## 2018-03-27 DIAGNOSIS — I10 Essential (primary) hypertension: Secondary | ICD-10-CM | POA: Diagnosis not present

## 2018-03-27 DIAGNOSIS — R0789 Other chest pain: Secondary | ICD-10-CM | POA: Diagnosis not present

## 2018-04-03 DIAGNOSIS — Z8673 Personal history of transient ischemic attack (TIA), and cerebral infarction without residual deficits: Secondary | ICD-10-CM | POA: Diagnosis not present

## 2018-04-03 DIAGNOSIS — E559 Vitamin D deficiency, unspecified: Secondary | ICD-10-CM | POA: Diagnosis not present

## 2018-04-03 DIAGNOSIS — M810 Age-related osteoporosis without current pathological fracture: Secondary | ICD-10-CM | POA: Diagnosis not present

## 2018-04-03 DIAGNOSIS — D649 Anemia, unspecified: Secondary | ICD-10-CM | POA: Diagnosis not present

## 2018-04-03 DIAGNOSIS — E119 Type 2 diabetes mellitus without complications: Secondary | ICD-10-CM | POA: Diagnosis not present

## 2018-04-03 DIAGNOSIS — E538 Deficiency of other specified B group vitamins: Secondary | ICD-10-CM | POA: Diagnosis not present

## 2018-04-03 DIAGNOSIS — E7800 Pure hypercholesterolemia, unspecified: Secondary | ICD-10-CM | POA: Diagnosis not present

## 2018-04-03 DIAGNOSIS — Z13828 Encounter for screening for other musculoskeletal disorder: Secondary | ICD-10-CM | POA: Diagnosis not present

## 2018-04-03 DIAGNOSIS — I10 Essential (primary) hypertension: Secondary | ICD-10-CM | POA: Diagnosis not present

## 2018-04-10 DIAGNOSIS — E538 Deficiency of other specified B group vitamins: Secondary | ICD-10-CM | POA: Diagnosis not present

## 2018-04-10 DIAGNOSIS — D649 Anemia, unspecified: Secondary | ICD-10-CM | POA: Diagnosis not present

## 2018-04-10 DIAGNOSIS — E119 Type 2 diabetes mellitus without complications: Secondary | ICD-10-CM | POA: Diagnosis not present

## 2018-04-12 DIAGNOSIS — E538 Deficiency of other specified B group vitamins: Secondary | ICD-10-CM | POA: Diagnosis not present

## 2018-04-19 DIAGNOSIS — E538 Deficiency of other specified B group vitamins: Secondary | ICD-10-CM | POA: Diagnosis not present

## 2018-04-26 DIAGNOSIS — E538 Deficiency of other specified B group vitamins: Secondary | ICD-10-CM | POA: Diagnosis not present

## 2018-05-02 ENCOUNTER — Encounter: Payer: Self-pay | Admitting: Internal Medicine

## 2018-05-03 DIAGNOSIS — E538 Deficiency of other specified B group vitamins: Secondary | ICD-10-CM | POA: Diagnosis not present

## 2018-05-11 DIAGNOSIS — E538 Deficiency of other specified B group vitamins: Secondary | ICD-10-CM | POA: Diagnosis not present

## 2018-08-09 DIAGNOSIS — E119 Type 2 diabetes mellitus without complications: Secondary | ICD-10-CM | POA: Diagnosis not present

## 2018-08-09 DIAGNOSIS — E7800 Pure hypercholesterolemia, unspecified: Secondary | ICD-10-CM | POA: Diagnosis not present

## 2018-08-09 DIAGNOSIS — I10 Essential (primary) hypertension: Secondary | ICD-10-CM | POA: Diagnosis not present

## 2018-08-09 DIAGNOSIS — D539 Nutritional anemia, unspecified: Secondary | ICD-10-CM | POA: Diagnosis not present

## 2018-08-09 DIAGNOSIS — E559 Vitamin D deficiency, unspecified: Secondary | ICD-10-CM | POA: Diagnosis not present

## 2018-08-09 DIAGNOSIS — D649 Anemia, unspecified: Secondary | ICD-10-CM | POA: Diagnosis not present

## 2018-08-25 ENCOUNTER — Emergency Department (HOSPITAL_BASED_OUTPATIENT_CLINIC_OR_DEPARTMENT_OTHER)
Admission: EM | Admit: 2018-08-25 | Discharge: 2018-08-26 | Disposition: A | Discharge status: Home / Self Care | Payer: Medicare Other | Attending: Emergency Medicine | Admitting: Emergency Medicine

## 2018-08-25 ENCOUNTER — Other Ambulatory Visit: Payer: Self-pay

## 2018-08-25 ENCOUNTER — Encounter (HOSPITAL_BASED_OUTPATIENT_CLINIC_OR_DEPARTMENT_OTHER): Payer: Self-pay | Admitting: *Deleted

## 2018-08-25 DIAGNOSIS — E119 Type 2 diabetes mellitus without complications: Secondary | ICD-10-CM | POA: Insufficient documentation

## 2018-08-25 DIAGNOSIS — Z79899 Other long term (current) drug therapy: Secondary | ICD-10-CM | POA: Diagnosis not present

## 2018-08-25 DIAGNOSIS — S4991XA Unspecified injury of right shoulder and upper arm, initial encounter: Secondary | ICD-10-CM | POA: Diagnosis present

## 2018-08-25 DIAGNOSIS — Y939 Activity, unspecified: Secondary | ICD-10-CM | POA: Diagnosis not present

## 2018-08-25 DIAGNOSIS — Y999 Unspecified external cause status: Secondary | ICD-10-CM | POA: Insufficient documentation

## 2018-08-25 DIAGNOSIS — W010XXA Fall on same level from slipping, tripping and stumbling without subsequent striking against object, initial encounter: Secondary | ICD-10-CM | POA: Insufficient documentation

## 2018-08-25 DIAGNOSIS — Y929 Unspecified place or not applicable: Secondary | ICD-10-CM | POA: Insufficient documentation

## 2018-08-25 DIAGNOSIS — S42301A Unspecified fracture of shaft of humerus, right arm, initial encounter for closed fracture: Secondary | ICD-10-CM | POA: Diagnosis not present

## 2018-08-25 DIAGNOSIS — S42254A Nondisplaced fracture of greater tuberosity of right humerus, initial encounter for closed fracture: Secondary | ICD-10-CM | POA: Diagnosis not present

## 2018-08-25 DIAGNOSIS — S8992XA Unspecified injury of left lower leg, initial encounter: Secondary | ICD-10-CM | POA: Diagnosis not present

## 2018-08-25 DIAGNOSIS — I10 Essential (primary) hypertension: Secondary | ICD-10-CM | POA: Diagnosis not present

## 2018-08-25 DIAGNOSIS — S42294A Other nondisplaced fracture of upper end of right humerus, initial encounter for closed fracture: Secondary | ICD-10-CM | POA: Diagnosis not present

## 2018-08-25 DIAGNOSIS — Z7982 Long term (current) use of aspirin: Secondary | ICD-10-CM | POA: Insufficient documentation

## 2018-08-25 HISTORY — DX: Encounter for other specified aftercare: Z51.89

## 2018-08-25 MED ORDER — OXYCODONE-ACETAMINOPHEN 5-325 MG PO TABS
1.0000 | ORAL_TABLET | Freq: Once | ORAL | Status: AC
  Administered 2018-08-25: 1 via ORAL
  Filled 2018-08-25: qty 1
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
## 2018-08-26 ENCOUNTER — Emergency Department (HOSPITAL_BASED_OUTPATIENT_CLINIC_OR_DEPARTMENT_OTHER): Payer: Medicare Other

## 2018-08-26 DIAGNOSIS — S8992XA Unspecified injury of left lower leg, initial encounter: Secondary | ICD-10-CM | POA: Diagnosis not present

## 2018-08-26 DIAGNOSIS — S42301A Unspecified fracture of shaft of humerus, right arm, initial encounter for closed fracture: Secondary | ICD-10-CM | POA: Diagnosis not present

## 2018-08-26 MED ORDER — OXYCODONE-ACETAMINOPHEN 5-325 MG PO TABS: 1.0000 | ORAL_TABLET | Freq: Four times a day (QID) | ORAL | 0 refills | Status: AC | PRN

## 2018-08-26 MED ORDER — OXYCODONE-ACETAMINOPHEN 5-325 MG PO TABS
1.0000 | ORAL_TABLET | Freq: Once | ORAL | Status: AC
  Administered 2018-08-26: 1 via ORAL
  Filled 2018-08-26: qty 1

## 2018-08-26 NOTE — ED Provider Notes (Signed)
[facility] EMERGENCY DEPARTMENT Provider Note   CSN: [id number] Arrival date & time: 08/25/18  2249    History   Chief Complaint Chief Complaint  Patient presents with  . Fall    HPI Amanda Conway is a 78 y.o. female.     HPI  This is a 78 year old female with a history of diabetes, stroke, hypertension, hyperlipidemia who presents following a fall.  Patient reports that she tripped over a paver and put her hands out in front of her to protect her face.  She fell mostly onto her right side.  She reports right shoulder pain and right neck pain.  She denies hitting her head or loss of consciousness.  She rates her pain at 10 out of 10.  It is worse with movement.  She has not taken anything for her pain.  She reports she has had multiple surgeries on the right shoulder.  She is also reporting pain in the left knee.  He has been ambulatory.  Past Medical History:  Diagnosis Date  . Blood transfusion without reported diagnosis   . CVA (cerebral vascular accident) ([place])   . Diabetes mellitus   . High cholesterol   . Hypertension   . Hyperthyroidism     Patient Active Problem List   Diagnosis Date Noted  . Cerebrovascular accident (CVA) due to occlusion of cerebral artery ([place])   . CVA (cerebral infarction) 04/12/2015  . Type 2 diabetes mellitus ([place]) 04/12/2015  . History of hyperthyroidism 04/12/2015  . 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 Hyperlipidemia 01/17/2007  . Essential hypertension 01/17/2007    Past Surgical History:  Procedure Laterality Date  . bilateral rotator cuff repair       OB History   No obstetric history on file.      Home Medications    Prior to Admission medications   Medication Sig Start Date End Date Taking? Authorizing Provider  amLODipine (NORVASC) 5 MG tablet TAKE 1 TABLET BY MOUTH ONCE DAILY 04/18/17  Yes Nafziger, Tommi Rumps, NP  aspirin EC 325 MG EC tablet Take 1 tablet (325 mg total) by mouth daily. 04/14/15  Yes  Charlynne Cousins, MD  benazepril (LOTENSIN) 20 MG tablet Take 1 tablet (20 mg total) by mouth daily. 06/01/16  Yes Nafziger, Tommi Rumps, NP  calcium acetate, Phos Binder, (PHOSLYRA) 667 MG/5ML SOLN Take by mouth 3 (three) times daily with meals.   Yes [provider]  cholecalciferol (VITAMIN D3) 25 MCG (1000 UT) tablet Take 1,000 Units by mouth daily.   Yes [provider]  glipiZIDE (GLUCOTROL) 10 MG tablet TAKE 1 TABLET BY MOUTH TWICE DAILY BEFORE A MEAL 09/26/16  Yes Nafziger, Tommi Rumps, NP  glucose blood test strip Use as instructed 06/08/16  Yes Nafziger, Tommi Rumps, NP  Lancets (ONETOUCH ULTRASOFT) lancets Use as instructed 06/08/16  Yes Nafziger, Tommi Rumps, NP  Multiple Vitamins-Minerals (MULTIVITAL PO) Take by mouth.   Yes [provider]  vitamin B-12 (CYANOCOBALAMIN) 50 MCG tablet Take 50 mcg by mouth daily.   Yes [provider]  Nutritional Supplements (JOINT FORMULA PO) Take by mouth. Joint juice    [provider]  nystatin (MYCOSTATIN/NYSTOP) powder Apply topically 2 (two) times daily. 07/21/16   Nafziger, Tommi Rumps, NP  Omega-3 Fatty Acids (FISH OIL) 1200 MG CPDR Take 1 capsule by mouth 3 (three) times a week.     [provider]  oxyCODONE-acetaminophen (PERCOCET/ROXICET) 5-325 MG tablet Take 1-2 tablets by mouth every 6 (six) hours as needed  for severe pain. 08/26/18   Horton, Barbette Hair, MD    Family History Family History  Problem Relation Age of Onset  . Diabetes Mother   . Mesothelioma Father     Social History Social History   Tobacco Use  . Smoking status: Former Smoker    Types: Cigarettes    Quit date: 02/28/1971    Years since quitting: 47.5  . Smokeless tobacco: Never Used  Substance Use Topics  . Alcohol use: No  . Drug use: No     Allergies   Banana, Corn-containing products, Fish allergy, Metformin and related, Statins, and Sulfonamide derivatives   Review of Systems Review of Systems  Constitutional: Negative for fever.   Respiratory: Negative for shortness of breath.   Cardiovascular: Negative for chest pain.  Gastrointestinal: Negative for abdominal pain, nausea and vomiting.  Musculoskeletal: Positive for neck pain.       Shoulder pain  Skin: Positive for wound.  Neurological: Negative for weakness and numbness.  All other systems reviewed and are negative.    Physical Exam Updated Vital Signs BP (!) 150/78 (BP Location: Left Arm)   Pulse 87   Temp 97.8 F (36.6 C) (Oral)   Resp 18   Ht 1.524 m (5')   Wt 61.2 kg   SpO2 99%   BMI 26.37 kg/m   Physical Exam Vitals signs and nursing note reviewed.  Constitutional:      Appearance: She is well-developed.     Comments: ABCs intact  HENT:     Head: Normocephalic and atraumatic.  Eyes:     Pupils: Pupils are equal, round, and reactive to light.  Neck:     Comments: Tenderness palpation over the right paraspinous muscle region of the cervical spine, no midline cervical spine tenderness, step-off, deformity Cardiovascular:     Rate and Rhythm: Normal rate and regular rhythm.     Heart sounds: Normal heart sounds.  Pulmonary:     Effort: Pulmonary effort is normal. No respiratory distress.     Breath sounds: No wheezing.  Abdominal:     General: Bowel sounds are normal.     Palpations: Abdomen is soft.     Tenderness: There is no abdominal tenderness.  Musculoskeletal:     Comments: Tenderness to palpation over the right shoulder without obvious deformity, no clavicular deformity noted, limited range of motion secondary to pain, 2+ radial pulse  Skin:    General: Skin is warm and dry.     Comments: Abrasion left knee  Neurological:     Mental Status: She is alert and oriented to person, place, and time.  Psychiatric:        Mood and Affect: Mood normal.      ED Treatments / Results  Labs (all labs ordered are listed, but only abnormal results are displayed) Labs Reviewed - No data to display  EKG None  Radiology Dg Shoulder  Right  Result Date: 08/26/2018 CLINICAL DATA:  Fall EXAM: RIGHT SHOULDER - 2+ VIEW COMPARISON:  None. FINDINGS: Acute comminuted fracture involving the right humeral neck and greater tuberosity with laterally displaced greater tuberosity fracture fragment. Slight inferior positioning of the humeral head with respect to the glenoid fossa, there may be slight anterior positioning as well. IMPRESSION: Acute comminuted proximal right humerus fracture with possible slight inferior and anterior subluxation of the humeral head. Electronically Signed   By: Donavan Foil M.D.   On: 08/26/2018 00:48   Dg Knee Complete 4 Views Left  Result  Date: 08/26/2018 CLINICAL DATA:  Fall EXAM: LEFT KNEE - COMPLETE 4+ VIEW COMPARISON:  None. FINDINGS: No fracture or malalignment. Trace knee effusion. Mild degenerative change of the medial compartment. Vascular calcification IMPRESSION: No acute osseous abnormality Electronically Signed   By: Donavan Foil M.D.   On: 08/26/2018 00:48    Procedures Procedures (including critical care time)  Medications Ordered in ED Medications  oxyCODONE-acetaminophen (PERCOCET/ROXICET) 5-325 MG per tablet 1 tablet (1 tablet Oral Given 08/25/18 2357)  oxyCODONE-acetaminophen (PERCOCET/ROXICET) 5-325 MG per tablet 1 tablet (1 tablet Oral Given 08/26/18 0126)     Initial Impression / Assessment and Plan / ED Course  I have reviewed the triage vital signs and the nursing notes.  Pertinent labs & imaging results that were available during my care of the patient were reviewed by me and considered in my medical decision making (see chart for details).        Patient presents with right arm pain after reported mechanical fall.  She is overall nontoxic-appearing.  Vital signs are reassuring.  Minimal range of motion of the right arm secondary to pain.  X-rays were obtained.  Patient was given Percocet for pain.  She is neurovascularly intact.  X-rays show a comminuted proximal humerus  fracture with possible subluxation.  Patient was placed in a sling immobilizer.  X-rays of the knee are negative.  Patient reports that she has previously seen Dr. Eddie Dibbles with [facility] and she would like to follow-up with [facility].  She was given the follow-up information for on-call, Dr. Rhona Raider.  Maintain splint and take medications as prescribed.  After history, exam, and medical workup I feel the patient has been appropriately medically screened and is safe for discharge home. Pertinent diagnoses were discussed with the patient. Patient was given return precautions.  Final Clinical Impressions(s) / ED Diagnoses   Final diagnoses:  Other closed nondisplaced fracture of proximal end of right humerus, initial encounter    ED Discharge Orders         Ordered    oxyCODONE-acetaminophen (PERCOCET/ROXICET) 5-325 MG tablet  Every 6 hours PRN     08/26/18 0148           Merryl Hacker, MD 08/26/18 [phone]

## 2018-08-26 NOTE — Discharge Instructions (Addendum)
Seen today and found to have a fracture of your humerus.  Keep immobilized and take medications as prescribed.  Follow-up as provided.

## 2018-08-27 DIAGNOSIS — S42201A Unspecified fracture of upper end of right humerus, initial encounter for closed fracture: Secondary | ICD-10-CM | POA: Diagnosis not present

## 2018-08-27 DIAGNOSIS — M25511 Pain in right shoulder: Secondary | ICD-10-CM | POA: Diagnosis not present

## 2018-09-10 DIAGNOSIS — S42201A Unspecified fracture of upper end of right humerus, initial encounter for closed fracture: Secondary | ICD-10-CM | POA: Diagnosis not present

## 2018-10-08 DIAGNOSIS — S42201A Unspecified fracture of upper end of right humerus, initial encounter for closed fracture: Secondary | ICD-10-CM | POA: Diagnosis not present

## 2018-10-15 DIAGNOSIS — M25611 Stiffness of right shoulder, not elsewhere classified: Secondary | ICD-10-CM | POA: Diagnosis not present

## 2018-10-15 DIAGNOSIS — S42201D Unspecified fracture of upper end of right humerus, subsequent encounter for fracture with routine healing: Secondary | ICD-10-CM | POA: Diagnosis not present

## 2018-11-27 DIAGNOSIS — E119 Type 2 diabetes mellitus without complications: Secondary | ICD-10-CM | POA: Diagnosis not present

## 2018-11-27 DIAGNOSIS — K219 Gastro-esophageal reflux disease without esophagitis: Secondary | ICD-10-CM | POA: Diagnosis not present

## 2018-11-27 DIAGNOSIS — E559 Vitamin D deficiency, unspecified: Secondary | ICD-10-CM | POA: Diagnosis not present

## 2018-11-27 DIAGNOSIS — Z1159 Encounter for screening for other viral diseases: Secondary | ICD-10-CM | POA: Diagnosis not present

## 2018-11-27 DIAGNOSIS — E7800 Pure hypercholesterolemia, unspecified: Secondary | ICD-10-CM | POA: Diagnosis not present

## 2018-11-27 DIAGNOSIS — I10 Essential (primary) hypertension: Secondary | ICD-10-CM | POA: Diagnosis not present

## 2018-11-27 DIAGNOSIS — D518 Other vitamin B12 deficiency anemias: Secondary | ICD-10-CM | POA: Diagnosis not present

## 2018-12-13 DIAGNOSIS — I10 Essential (primary) hypertension: Secondary | ICD-10-CM | POA: Diagnosis not present

## 2018-12-13 DIAGNOSIS — E7800 Pure hypercholesterolemia, unspecified: Secondary | ICD-10-CM | POA: Diagnosis not present

## 2018-12-13 DIAGNOSIS — E119 Type 2 diabetes mellitus without complications: Secondary | ICD-10-CM | POA: Diagnosis not present

## 2018-12-13 DIAGNOSIS — Z8673 Personal history of transient ischemic attack (TIA), and cerebral infarction without residual deficits: Secondary | ICD-10-CM | POA: Diagnosis not present

## 2020-01-20 IMAGING — DX LEFT KNEE - COMPLETE 4+ VIEW
4 series · 4 of 4 positions shown · non-contrast
Comparison: None.

CLINICAL DATA: Fall

EXAM:
LEFT KNEE - COMPLETE 4+ VIEW

[knee ap]
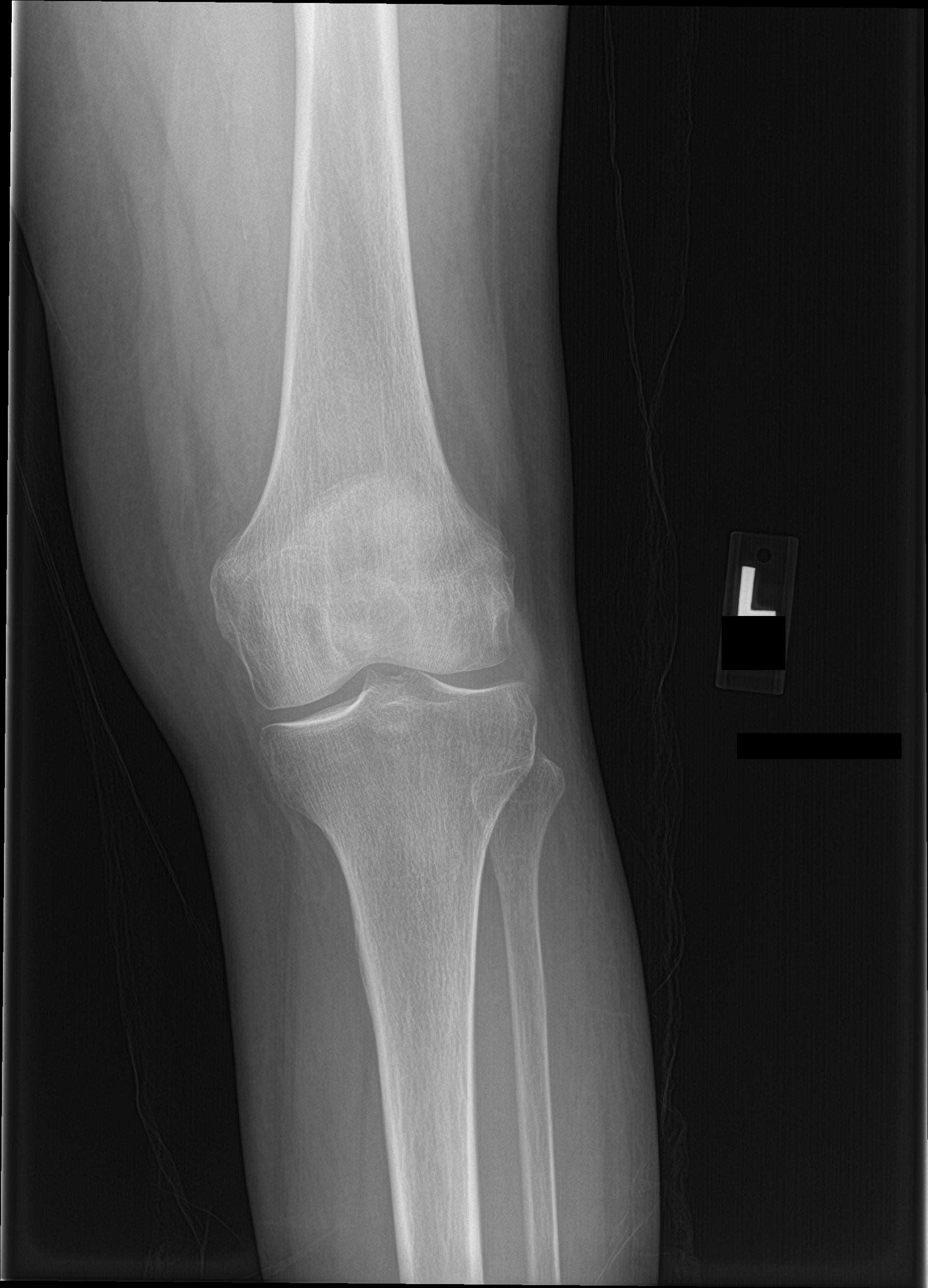

[knee lat]
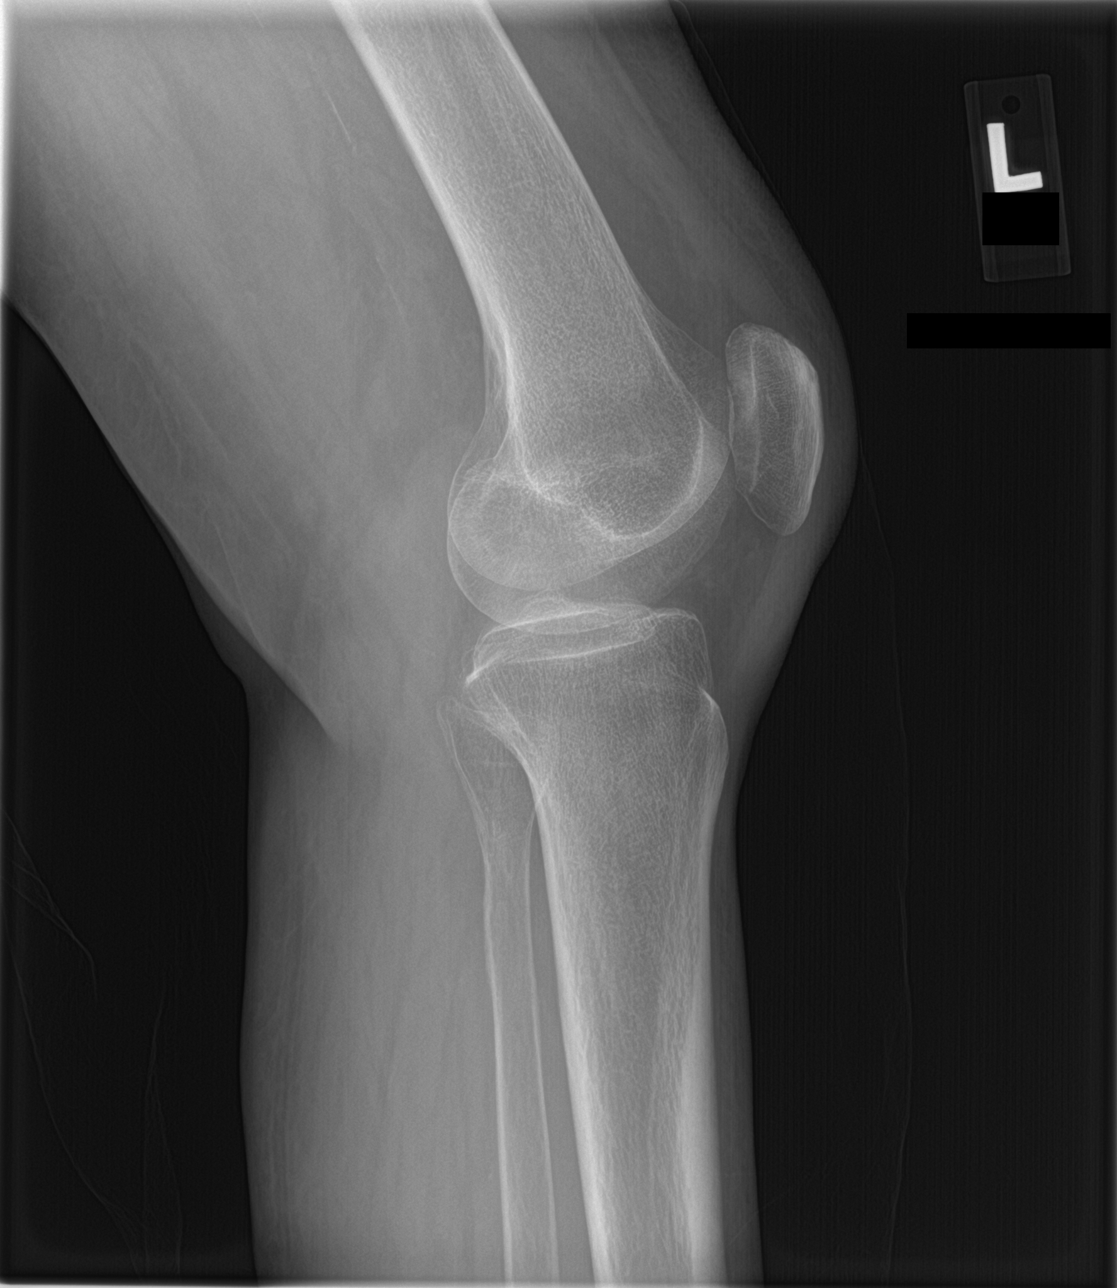

[knee obl (1 of 2)]
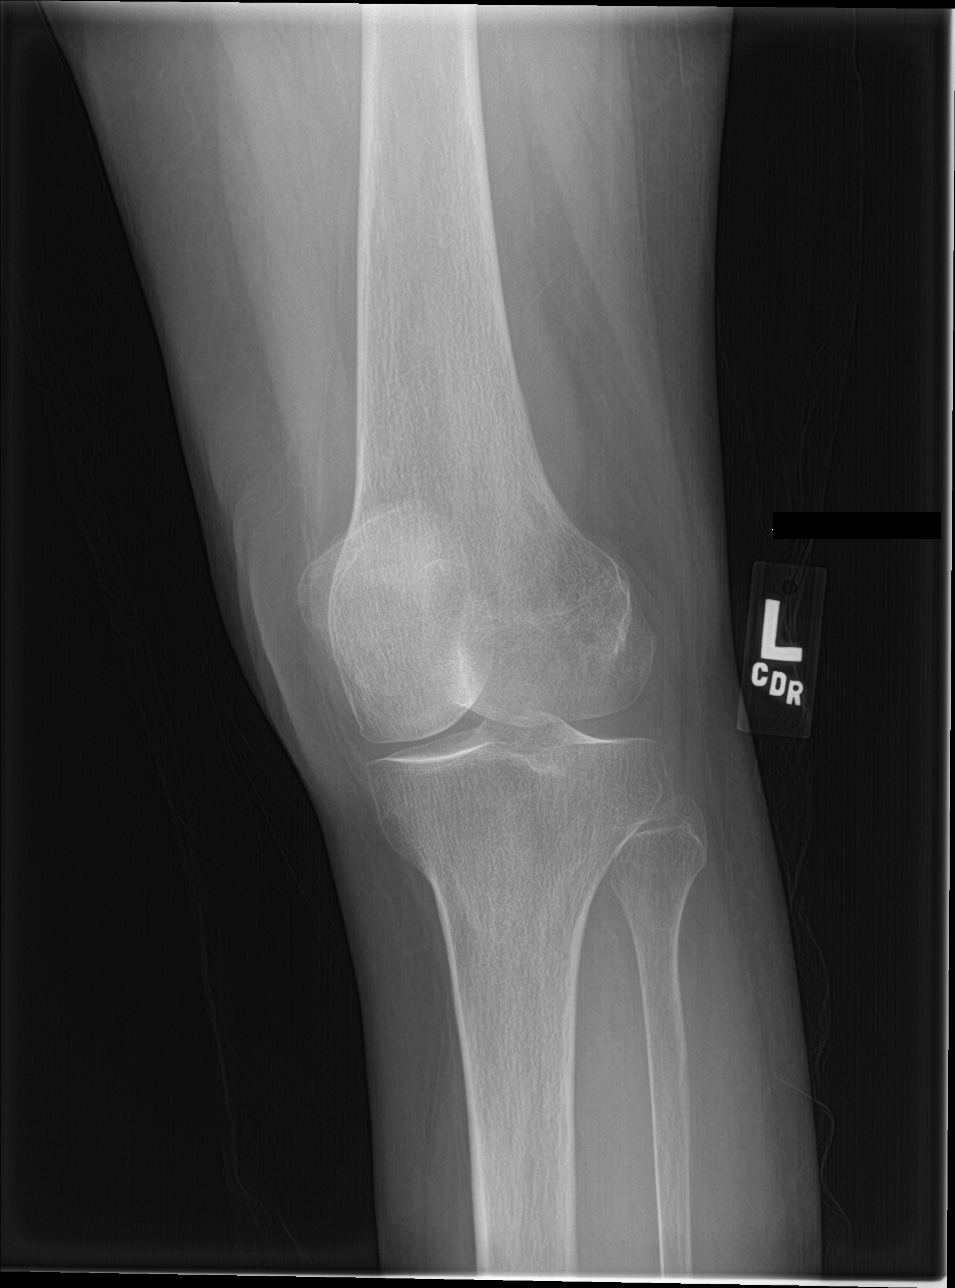

[knee obl (2 of 2)]
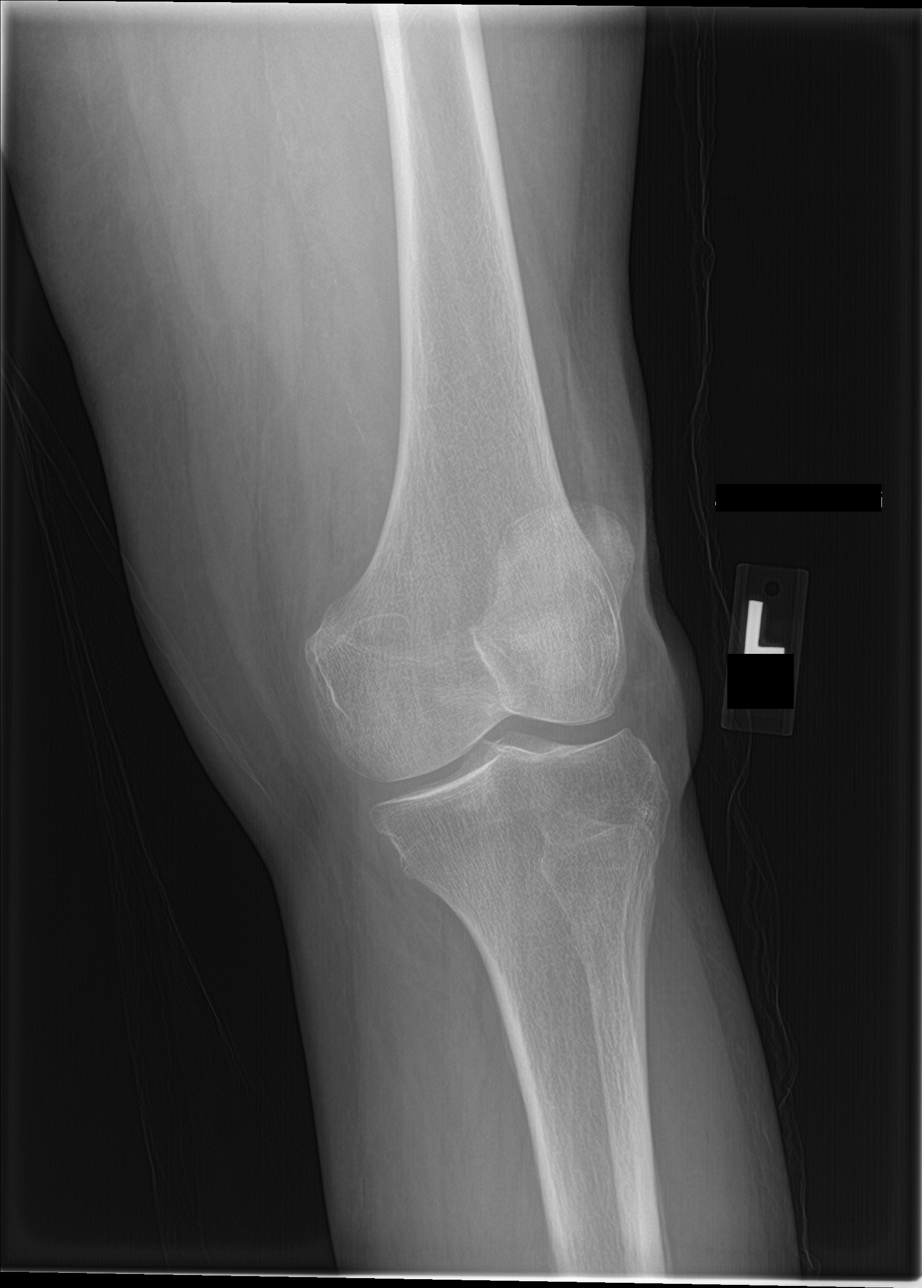

[4 of 4 positions shown; findings below may reference images not displayed]

FINDINGS: No fracture or malalignment. Trace knee effusion. Mild degenerative
change of the medial compartment. Vascular calcification
IMPRESSION: No acute osseous abnormality

## 2020-07-13 ENCOUNTER — Encounter (HOSPITAL_COMMUNITY): Payer: Self-pay

## 2020-07-13 ENCOUNTER — Emergency Department (HOSPITAL_COMMUNITY): Payer: Medicare (Managed Care)

## 2020-07-13 ENCOUNTER — Emergency Department (HOSPITAL_COMMUNITY)
Admission: EM | Admit: 2020-07-13 | Discharge: 2020-07-14 | Disposition: A | Discharge status: Home / Self Care | Payer: Medicare (Managed Care) | Attending: Emergency Medicine | Admitting: Emergency Medicine

## 2020-07-13 ENCOUNTER — Other Ambulatory Visit: Payer: Self-pay

## 2020-07-13 DIAGNOSIS — E119 Type 2 diabetes mellitus without complications: Secondary | ICD-10-CM | POA: Diagnosis not present

## 2020-07-13 DIAGNOSIS — Z7982 Long term (current) use of aspirin: Secondary | ICD-10-CM | POA: Insufficient documentation

## 2020-07-13 DIAGNOSIS — Z87891 Personal history of nicotine dependence: Secondary | ICD-10-CM | POA: Insufficient documentation

## 2020-07-13 DIAGNOSIS — I10 Essential (primary) hypertension: Secondary | ICD-10-CM | POA: Diagnosis not present

## 2020-07-13 DIAGNOSIS — M545 Low back pain, unspecified: Secondary | ICD-10-CM | POA: Insufficient documentation

## 2020-07-13 DIAGNOSIS — M79642 Pain in left hand: Secondary | ICD-10-CM | POA: Insufficient documentation

## 2020-07-13 DIAGNOSIS — Z79899 Other long term (current) drug therapy: Secondary | ICD-10-CM | POA: Insufficient documentation

## 2020-07-13 DIAGNOSIS — M25561 Pain in right knee: Secondary | ICD-10-CM | POA: Insufficient documentation

## 2020-07-13 DIAGNOSIS — R0789 Other chest pain: Secondary | ICD-10-CM | POA: Diagnosis present

## 2020-07-13 DIAGNOSIS — Z7984 Long term (current) use of oral hypoglycemic drugs: Secondary | ICD-10-CM | POA: Diagnosis not present

## 2020-07-13 DIAGNOSIS — S20219A Contusion of unspecified front wall of thorax, initial encounter: Secondary | ICD-10-CM

## 2020-07-13 DIAGNOSIS — S29019A Strain of muscle and tendon of unspecified wall of thorax, initial encounter: Secondary | ICD-10-CM

## 2020-07-13 DIAGNOSIS — S60222A Contusion of left hand, initial encounter: Secondary | ICD-10-CM

## 2020-07-13 DIAGNOSIS — S8001XA Contusion of right knee, initial encounter: Secondary | ICD-10-CM

## 2020-07-13 DIAGNOSIS — S161XXA Strain of muscle, fascia and tendon at neck level, initial encounter: Secondary | ICD-10-CM

## 2020-07-13 DIAGNOSIS — Y92410 Unspecified street and highway as the place of occurrence of the external cause: Secondary | ICD-10-CM | POA: Diagnosis not present

## 2020-07-13 HISTORY — DX: Cerebral infarction, unspecified: I63.9
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
## 2020-07-13 NOTE — ED Notes (Signed)
Call received from pt daughter Autumn Patty [phone] requesting rtn call for pt status/updates d/t ED wait times and pt medical hx. [employer]
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
## 2020-07-14 ENCOUNTER — Emergency Department (HOSPITAL_COMMUNITY): Payer: Medicare (Managed Care)

## 2020-07-14 ENCOUNTER — Encounter (HOSPITAL_COMMUNITY): Payer: Self-pay

## 2020-07-14 LAB — BASIC METABOLIC PANEL
Anion gap: 6 (ref 5–15)
BUN: 11 mg/dL (ref 8–23)
CO2: 27 mmol/L (ref 22–32)
Calcium: 8.8 mg/dL — ABNORMAL LOW (ref 8.9–10.3)
Chloride: 104 mmol/L (ref 98–111)
Creatinine, Ser: 0.66 mg/dL (ref 0.44–1.00)
GFR, Estimated: >60 mL/min (ref >60)
Glucose, Bld: 156 mg/dL — ABNORMAL HIGH (ref 70–99)
Potassium: 3.7 mmol/L (ref 3.5–5.1)
Sodium: 137 mmol/L (ref 135–145)

## 2020-07-14 LAB — CBC WITH DIFFERENTIAL/PLATELET
Abs Immature Granulocytes: 0.02 10*3/uL (ref 0.00–0.07)
Basophils Absolute: 0.1 10*3/uL (ref 0.0–0.1)
Basophils Relative: 1 %
Eosinophils Absolute: 0.2 10*3/uL (ref 0.0–0.5)
Eosinophils Relative: 2 %
HCT: 42.8 % (ref 36.0–46.0)
Hemoglobin: 13.9 g/dL (ref 12.0–15.0)
Immature Granulocytes: 0 %
Lymphocytes Relative: 17 %
Lymphs Abs: 1.5 10*3/uL (ref 0.7–4.0)
MCH: 30.6 pg (ref 26.0–34.0)
MCHC: 32.5 g/dL (ref 30.0–36.0)
MCV: 94.3 fL (ref 80.0–100.0)
Monocytes Absolute: 0.7 10*3/uL (ref 0.1–1.0)
Monocytes Relative: 8 %
Neutro Abs: 6.3 10*3/uL (ref 1.7–7.7)
Neutrophils Relative %: 72 %
Platelets: 337 10*3/uL (ref 150–400)
RBC: 4.54 MIL/uL (ref 3.87–5.11)
RDW: 13.2 % (ref 11.5–15.5)
WBC: 8.8 10*3/uL (ref 4.0–10.5)
nRBC: 0 % (ref 0.0–0.2)

## 2020-07-14 LAB — TROPONIN I (HIGH SENSITIVITY): Troponin I (High Sensitivity): 3 ng/L (ref <18)

## 2020-07-14 NOTE — Discharge Instructions (Signed)
Take ibuprofen 600 mg every 6 hours as needed for pain.  Return to the ER if symptoms significantly worsen or change.

## 2023-03-16 ENCOUNTER — Other Ambulatory Visit: Payer: Self-pay | Admitting: Internal Medicine

## 2023-03-16 DIAGNOSIS — Z1231 Encounter for screening mammogram for malignant neoplasm of breast: Secondary | ICD-10-CM

## 2024-01-15 ENCOUNTER — Ambulatory Visit
Admission: RE | Admit: 2024-01-15 | Discharge: 2024-01-15 | Disposition: A | Discharge status: Home / Self Care | Source: Ambulatory Visit | Attending: Internal Medicine | Admitting: Internal Medicine

## 2024-01-15 DIAGNOSIS — Z1231 Encounter for screening mammogram for malignant neoplasm of breast: Secondary | ICD-10-CM
